# Patient Record
Sex: Female | Born: 1980 | Race: White | Hispanic: Yes | Marital: Married | Smoking: Never smoker
Health system: Southern US, Community
[De-identification: ages and names within clinical notes are randomized; demographics above are authoritative.]

## PROBLEM LIST (undated history)

## (undated) DIAGNOSIS — Z789 Other specified health status: Secondary | ICD-10-CM

## (undated) DIAGNOSIS — B977 Papillomavirus as the cause of diseases classified elsewhere: Secondary | ICD-10-CM

## (undated) HISTORY — PX: NO PAST SURGERIES: SHX2092

## (undated) HISTORY — PX: BREAST BIOPSY: SHX20

---

## 2005-01-07 ENCOUNTER — Ambulatory Visit: Payer: Self-pay | Admitting: Internal Medicine

## 2005-09-15 ENCOUNTER — Ambulatory Visit: Payer: Self-pay | Admitting: Internal Medicine

## 2007-05-07 ENCOUNTER — Encounter: Payer: Self-pay | Admitting: Internal Medicine

## 2008-06-26 ENCOUNTER — Encounter: Payer: Self-pay | Admitting: Family Medicine

## 2008-06-26 ENCOUNTER — Other Ambulatory Visit: Admission: RE | Admit: 2008-06-26 | Discharge: 2008-06-26 | Payer: Self-pay | Admitting: Family Medicine

## 2008-06-26 ENCOUNTER — Ambulatory Visit: Payer: Self-pay | Admitting: Family Medicine

## 2008-06-30 ENCOUNTER — Ambulatory Visit: Payer: Self-pay | Admitting: Family Medicine

## 2008-07-11 LAB — CONVERTED CEMR LAB
AST: 17 units/L (ref 0–37)
Albumin: 4.8 g/dL (ref 3.5–5.2)
Alkaline Phosphatase: 92 units/L (ref 39–117)
BUN: 16 mg/dL (ref 6–23)
Band Neutrophils: 0 % (ref 0–10)
Basophils Relative: 0 % (ref 0–1)
Bilirubin, Direct: 0.1 mg/dL (ref 0.0–0.3)
CO2: 24 meq/L (ref 19–32)
Calcium: 9.5 mg/dL (ref 8.4–10.5)
Creatinine, Ser: 0.68 mg/dL (ref 0.40–1.20)
Eosinophils Absolute: 0.1 10*3/uL (ref 0.0–0.7)
Eosinophils Relative: 1 % (ref 0–5)
HDL: 74 mg/dL (ref >39)
Indirect Bilirubin: 0.4 mg/dL (ref 0.0–0.9)
MCHC: 35.5 g/dL (ref 30.0–36.0)
Monocytes Relative: 7 % (ref 3–12)
Neutrophils Relative %: 60 % (ref 43–77)
RDW: 13.5 % (ref 11.5–15.5)
Total Bilirubin: 0.5 mg/dL (ref 0.3–1.2)

## 2008-08-03 ENCOUNTER — Ambulatory Visit: Payer: Self-pay | Admitting: Family Medicine

## 2008-08-03 DIAGNOSIS — N949 Unspecified condition associated with female genital organs and menstrual cycle: Secondary | ICD-10-CM

## 2009-03-20 ENCOUNTER — Inpatient Hospital Stay (HOSPITAL_COMMUNITY): Admission: AD | Admit: 2009-03-20 | Discharge: 2009-03-22 | Payer: Self-pay | Admitting: Obstetrics and Gynecology

## 2010-07-31 LAB — CBC
HCT: 25.9 % — ABNORMAL LOW (ref 36.0–46.0)
HCT: 36.7 % (ref 36.0–46.0)
Hemoglobin: 12.2 g/dL (ref 12.0–15.0)
Hemoglobin: 8.8 g/dL — ABNORMAL LOW (ref 12.0–15.0)
MCHC: 33.2 g/dL (ref 30.0–36.0)
MCHC: 33.9 g/dL (ref 30.0–36.0)
MCV: 91.3 fL (ref 78.0–100.0)
MCV: 91.5 fL (ref 78.0–100.0)
Platelets: 155 10*3/uL (ref 150–400)
RBC: 4.01 MIL/uL (ref 3.87–5.11)
RDW: 14.4 % (ref 11.5–15.5)
WBC: 8.9 10*3/uL (ref 4.0–10.5)

## 2011-04-29 NOTE — L&D Delivery Note (Signed)
Delivery Note   Onset of labor 2300 SROM - PCN protocol for GBS carrier status Variables in labor - mild with nadir to 90  Analgesia /Anesthesia intrapartum: epidural  Complete dilation at 0221 at +2 Onset of pushing at 0221 FHR second stage mild variables to nadir 90  Delivery of a viable female at 38 by CNM rotation from OT to LOA with crowning position.  Nuchal Cord none - noted false knot 4 cm from fetal insertion site . Cord double clamped after cessation of pulsation, cut by FOB.  Cord blood sample collected.   Placenta delivered shultz intact with 3 VC at 0238 Placenta for disposal. Uterine tone intermittent atony - responded to IV pitocin / uterine massage with expression of clots /  bleeding moderate Urinary straight catheterization by nurse to decompress bladder Superficial  Laceration at introitus identified - single suture 4-0 for hemostasis  Est. Blood Loss (mL):400   Complications: none  Mom to postpartum.  Baby to Mom for bonding / breast-feeding.  Marlinda Mike CNM, MSN 01/17/2012, 2:51 AM

## 2011-06-12 LAB — OB RESULTS CONSOLE RUBELLA ANTIBODY, IGM: Rubella: IMMUNE

## 2011-06-12 LAB — OB RESULTS CONSOLE ABO/RH: RH Type: NEGATIVE

## 2011-06-12 LAB — OB RESULTS CONSOLE ANTIBODY SCREEN: Antibody Screen: NEGATIVE

## 2011-06-23 LAB — OB RESULTS CONSOLE GC/CHLAMYDIA: Gonorrhea: NEGATIVE

## 2012-01-17 ENCOUNTER — Encounter (HOSPITAL_COMMUNITY): Payer: Self-pay | Admitting: *Deleted

## 2012-01-17 ENCOUNTER — Inpatient Hospital Stay (HOSPITAL_COMMUNITY)
Admission: AD | Admit: 2012-01-17 | Discharge: 2012-01-19 | DRG: 372 | Disposition: A | Discharge status: Home / Self Care | Payer: BC Managed Care – PPO | Source: Ambulatory Visit | Attending: Obstetrics | Admitting: Obstetrics

## 2012-01-17 ENCOUNTER — Inpatient Hospital Stay (HOSPITAL_COMMUNITY): Payer: BC Managed Care – PPO | Admitting: Anesthesiology

## 2012-01-17 ENCOUNTER — Encounter (HOSPITAL_COMMUNITY): Payer: Self-pay | Admitting: Anesthesiology

## 2012-01-17 DIAGNOSIS — O9903 Anemia complicating the puerperium: Principal | ICD-10-CM | POA: Diagnosis not present

## 2012-01-17 DIAGNOSIS — O99892 Other specified diseases and conditions complicating childbirth: Secondary | ICD-10-CM | POA: Diagnosis present

## 2012-01-17 DIAGNOSIS — D62 Acute posthemorrhagic anemia: Secondary | ICD-10-CM | POA: Diagnosis not present

## 2012-01-17 DIAGNOSIS — Z22330 Carrier of Group B streptococcus: Secondary | ICD-10-CM

## 2012-01-17 HISTORY — DX: Papillomavirus as the cause of diseases classified elsewhere: B97.7

## 2012-01-17 HISTORY — DX: Other specified health status: Z78.9

## 2012-01-17 LAB — CBC
HCT: 33.5 % — ABNORMAL LOW (ref 36.0–46.0)
Hemoglobin: 11.3 g/dL — ABNORMAL LOW (ref 12.0–15.0)
MCH: 30.2 pg (ref 26.0–34.0)
MCHC: 33.7 g/dL (ref 30.0–36.0)
MCV: 89.6 fL (ref 78.0–100.0)
Platelets: 185 10*3/uL (ref 150–400)
RBC: 3.74 MIL/uL — ABNORMAL LOW (ref 3.87–5.11)
RDW: 13.6 % (ref 11.5–15.5)
WBC: 7 10*3/uL (ref 4.0–10.5)

## 2012-01-17 LAB — TYPE AND SCREEN
ABO/RH(D): O NEG
Antibody Screen: POSITIVE
DAT, IgG: NEGATIVE

## 2012-01-17 LAB — RPR: RPR Ser Ql: NONREACTIVE

## 2012-01-17 MED ORDER — LACTATED RINGERS IV SOLN
INTRAVENOUS | Status: DC
Start: 2012-01-17 — End: 2012-01-17

## 2012-01-17 MED ORDER — METHYLERGONOVINE MALEATE 0.2 MG/ML IJ SOLN: 0.2000 mg | INTRAMUSCULAR | Status: DC | PRN

## 2012-01-17 MED ORDER — IBUPROFEN 600 MG PO TABS
600.0000 mg | ORAL_TABLET | Freq: Four times a day (QID) | ORAL | Status: DC
  Administered 2012-01-17 – 2012-01-19 (×9): 600 mg via ORAL
  Filled 2012-01-17 (×9): qty 1

## 2012-01-17 MED ORDER — LANOLIN HYDROUS EX OINT: TOPICAL_OINTMENT | CUTANEOUS | Status: DC | PRN

## 2012-01-17 MED ORDER — PHENYLEPHRINE 40 MCG/ML (10ML) SYRINGE FOR IV PUSH (FOR BLOOD PRESSURE SUPPORT): 80.0000 ug | PREFILLED_SYRINGE | INTRAVENOUS | Status: DC | PRN

## 2012-01-17 MED ORDER — ONDANSETRON HCL 4 MG PO TABS: 4.0000 mg | ORAL_TABLET | ORAL | Status: DC | PRN

## 2012-01-17 MED ORDER — WITCH HAZEL-GLYCERIN EX PADS: 1.0000 "application " | MEDICATED_PAD | CUTANEOUS | Status: DC | PRN

## 2012-01-17 MED ORDER — IBUPROFEN 600 MG PO TABS: 600.0000 mg | ORAL_TABLET | Freq: Four times a day (QID) | ORAL | Status: DC | PRN

## 2012-01-17 MED ORDER — FENTANYL 2.5 MCG/ML BUPIVACAINE 1/10 % EPIDURAL INFUSION (WH - ANES)
14.0000 mL/h | INTRAMUSCULAR | Status: DC
  Administered 2012-01-17: 14 mL/h via EPIDURAL
  Filled 2012-01-17: qty 60

## 2012-01-17 MED ORDER — LACTATED RINGERS IV SOLN: 500.0000 mL | INTRAVENOUS | Status: DC | PRN

## 2012-01-17 MED ORDER — ONDANSETRON HCL 4 MG/2ML IJ SOLN: 4.0000 mg | Freq: Four times a day (QID) | INTRAMUSCULAR | Status: DC | PRN

## 2012-01-17 MED ORDER — METHYLERGONOVINE MALEATE 0.2 MG PO TABS: 0.2000 mg | ORAL_TABLET | ORAL | Status: DC | PRN

## 2012-01-17 MED ORDER — OXYCODONE-ACETAMINOPHEN 5-325 MG PO TABS: 1.0000 | ORAL_TABLET | ORAL | Status: DC | PRN

## 2012-01-17 MED ORDER — PENICILLIN G POTASSIUM 5000000 UNITS IJ SOLR: 5.0000 10*6.[IU] | Freq: Once | INTRAVENOUS | Status: DC

## 2012-01-17 MED ORDER — OXYCODONE-ACETAMINOPHEN 5-325 MG PO TABS
1.0000 | ORAL_TABLET | ORAL | Status: DC | PRN
  Administered 2012-01-18: 2 via ORAL
  Filled 2012-01-17: qty 2

## 2012-01-17 MED ORDER — PENICILLIN G POTASSIUM 5000000 UNITS IJ SOLR: 2.5000 10*6.[IU] | INTRAVENOUS | Status: DC

## 2012-01-17 MED ORDER — DIPHENHYDRAMINE HCL 50 MG/ML IJ SOLN: 12.5000 mg | INTRAMUSCULAR | Status: DC | PRN

## 2012-01-17 MED ORDER — LIDOCAINE HCL (PF) 1 % IJ SOLN
INTRAMUSCULAR | Status: DC | PRN
  Administered 2012-01-17 (×2): 5 mL

## 2012-01-17 MED ORDER — PENICILLIN G POTASSIUM 5000000 UNITS IJ SOLR
5.0000 10*6.[IU] | Freq: Once | INTRAVENOUS | Status: AC
  Administered 2012-01-17: 5 10*6.[IU] via INTRAVENOUS
  Filled 2012-01-17: qty 5

## 2012-01-17 MED ORDER — SIMETHICONE 80 MG PO CHEW: 80.0000 mg | CHEWABLE_TABLET | ORAL | Status: DC | PRN

## 2012-01-17 MED ORDER — OXYTOCIN BOLUS FROM INFUSION
500.0000 mL | Freq: Once | INTRAVENOUS | Status: DC
  Filled 2012-01-17: qty 500

## 2012-01-17 MED ORDER — OXYTOCIN 40 UNITS IN LACTATED RINGERS INFUSION - SIMPLE MED
62.5000 mL/h | Freq: Once | INTRAVENOUS | Status: AC
  Administered 2012-01-17: 62.5 mL/h via INTRAVENOUS
  Filled 2012-01-17: qty 1000

## 2012-01-17 MED ORDER — BENZOCAINE-MENTHOL 20-0.5 % EX AERO: 1.0000 "application " | INHALATION_SPRAY | CUTANEOUS | Status: DC | PRN

## 2012-01-17 MED ORDER — LACTATED RINGERS IV SOLN: 500.0000 mL | Freq: Once | INTRAVENOUS | Status: DC

## 2012-01-17 MED ORDER — ONDANSETRON HCL 4 MG/2ML IJ SOLN: 4.0000 mg | INTRAMUSCULAR | Status: DC | PRN

## 2012-01-17 MED ORDER — CITRIC ACID-SODIUM CITRATE 334-500 MG/5ML PO SOLN: 30.0000 mL | ORAL | Status: DC | PRN

## 2012-01-17 MED ORDER — PHENYLEPHRINE 40 MCG/ML (10ML) SYRINGE FOR IV PUSH (FOR BLOOD PRESSURE SUPPORT)
80.0000 ug | PREFILLED_SYRINGE | INTRAVENOUS | Status: DC | PRN
  Filled 2012-01-17: qty 5

## 2012-01-17 MED ORDER — EPHEDRINE 5 MG/ML INJ: 10.0000 mg | INTRAVENOUS | Status: DC | PRN

## 2012-01-17 MED ORDER — DIBUCAINE 1 % RE OINT: 1.0000 "application " | TOPICAL_OINTMENT | RECTAL | Status: DC | PRN

## 2012-01-17 MED ORDER — LIDOCAINE HCL (PF) 1 % IJ SOLN
30.0000 mL | INTRAMUSCULAR | Status: DC | PRN
  Filled 2012-01-17: qty 30

## 2012-01-17 MED ORDER — EPHEDRINE 5 MG/ML INJ
10.0000 mg | INTRAVENOUS | Status: DC | PRN
  Filled 2012-01-17: qty 4

## 2012-01-17 MED ORDER — ACETAMINOPHEN 325 MG PO TABS: 650.0000 mg | ORAL_TABLET | ORAL | Status: DC | PRN

## 2012-01-17 MED ORDER — SENNOSIDES-DOCUSATE SODIUM 8.6-50 MG PO TABS: 2.0000 | ORAL_TABLET | Freq: Every day | ORAL | Status: DC

## 2012-01-17 MED ORDER — PRENATAL 27-0.8 MG PO TABS: 1.0000 | ORAL_TABLET | Freq: Every day | ORAL | Status: DC

## 2012-01-17 MED ORDER — PENICILLIN G POTASSIUM 5000000 UNITS IJ SOLR
2.5000 10*6.[IU] | INTRAVENOUS | Status: DC
  Filled 2012-01-17 (×4): qty 2.5

## 2012-01-17 MED ORDER — TETANUS-DIPHTH-ACELL PERTUSSIS 5-2.5-18.5 LF-MCG/0.5 IM SUSP: 0.5000 mL | Freq: Once | INTRAMUSCULAR | Status: DC

## 2012-01-17 MED ORDER — DIPHENHYDRAMINE HCL 25 MG PO CAPS: 25.0000 mg | ORAL_CAPSULE | Freq: Four times a day (QID) | ORAL | Status: DC | PRN

## 2012-01-17 NOTE — Anesthesia Procedure Notes (Signed)
Epidural Patient location during procedure: OB Start time: 01/17/2012 1:52 AM  Staffing Anesthesiologist: Brayton Caves R Performed by: anesthesiologist   Preanesthetic Checklist Completed: patient identified, site marked, surgical consent, pre-op evaluation, timeout performed, IV checked, risks and benefits discussed and monitors and equipment checked  Epidural Patient position: sitting Prep: site prepped and draped and DuraPrep Patient monitoring: continuous pulse ox and blood pressure Approach: midline Injection technique: LOR air and LOR saline  Needle:  Needle type: Tuohy  Needle gauge: 17 G Needle length: 9 cm and 9 Needle insertion depth: 5 cm cm Catheter type: closed end flexible Catheter size: 19 Gauge Catheter at skin depth: 10 cm Test dose: negative  Assessment Events: blood not aspirated, injection not painful, no injection resistance, negative IV test and no paresthesia  Additional Notes Patient identified.  Risk benefits discussed including failed block, incomplete pain control, headache, nerve damage, paralysis, blood pressure changes, nausea, vomiting, reactions to medication both toxic or allergic, and postpartum back pain.  Patient expressed understanding and wished to proceed.  All questions were answered.  Sterile technique used throughout procedure and epidural site dressed with sterile barrier dressing. No paresthesia or other complications noted.The patient did not experience any signs of intravascular injection such as tinnitus or metallic taste in mouth nor signs of intrathecal spread such as rapid motor block. Please see nursing notes for vital signs.

## 2012-01-17 NOTE — Progress Notes (Signed)
S: Feeling ok - epidural just placed / having pressure with ctx     Tolerating contractions well - breathing with ctx  O:  VS: Blood pressure 101/60, pulse 76, temperature 98.1 F (36.7 C), temperature source Oral, resp. rate 18, height 5\' 4"  (1.626 m), weight 71.215 kg (157 lb).        FHR : baseline 135 / variability moderate / accels + / decels mild variable        Toco: contractions every 2-4 minutes / moderate to strong         Cervix : 8/90/vtx/+1        Membranes: clear fluid leakage  A: active labor     FHR category 1  P: anticipate rapid progression to SVB  Marlinda Mike CNM, MSN 01/17/2012, 2:02 AM

## 2012-01-17 NOTE — MAU Note (Signed)
Pt reports uc's since 2300 and LOF on her way to hospital

## 2012-01-17 NOTE — Anesthesia Postprocedure Evaluation (Signed)
  Anesthesia Post-op Note  Patient: Sheila Kennedy  Procedure(s) Performed: * No procedures listed *  Patient Location: PACU and Mother/Baby  Anesthesia Type: Epidural  Level of Consciousness: awake, alert , oriented and patient cooperative  Airway and Oxygen Therapy: Patient Spontanous Breathing  Post-op Pain: none  Post-op Assessment: Post-op Vital signs reviewed and Patient's Cardiovascular Status Stable  Post-op Vital Signs: Reviewed and stable  Complications: No apparent anesthesia complications

## 2012-01-17 NOTE — Progress Notes (Signed)
Marlinda Mike cnm notified of patient, tracing, ctx pattern, sve result. Order received to admit and start the penicillin protocol.

## 2012-01-17 NOTE — Anesthesia Preprocedure Evaluation (Signed)

## 2012-01-17 NOTE — H&P (Signed)
  OB ADMISSION/ HISTORY & PHYSICAL:  Admission Date: 01/17/2012 12:21 AM  Admit Diagnosis: normal labor / SROM   Sheila Kennedy is a 31 y.o. female presenting for onset of labor / SROM upon arrival to hospital.  Prenatal History: G2P1001   EDC : 01/30/2012, Alternate EDD Entry  Prenatal care at [facility] since [redacted] weeks gestation  Prenatal course complicated by GBS bacturia  Prenatal Labs: ABO, Rh: O (02/14 0000) negative Antibody: Negative (02/14 0000) Rubella: Immune (02/14 0000)  RPR: Nonreactive (02/14 0000)  HBsAg: Negative (02/14 0000)  HIV: Non-reactive (02/14 0000)  GBS: positive bacturia 1 hr Glucola : normal   Medical / Surgical History :  Past medical history:  Past Medical History  Diagnosis Date  . HPV in female   . No pertinent past medical history      Past surgical history:  Past Surgical History  Procedure Date  . No past surgeries      Family History:  Family History  Problem Relation Age of Onset  . Other Neg Hx      Social History:  reports that she has never smoked. She has never used smokeless tobacco. She reports that she does not drink alcohol or use illicit drugs.   Allergies: Review of patient's allergies indicates no known allergies.  NKDA  Current Medications at time of admission:  Prenatal vitamin  Review of Systems: Ctx starting at 11pm SROM 0100  Physical Exam:  Dilation: 5 Effacement (%): 80 Station: -2 Exam by:: B Mosca  General: Uncomfortable with ctx Heart:RRR Lungs: clear Abdomen:gravid and non-tender Extremities: trace edema  FHR: 135 moderate variability / moderate variability / + accels / mild variables nadir 120 x 10 sec TOCO: every 5 minutes   Assessment:  38 weeks latent labor with SROM (+) GBS bacturia Category 1 EFM tracing  Plan:  Admit PCN protocol Epidural for pain management Anticipate SVB  Marlinda Mike CNM, MSN 01/17/2012, 1:39 AM

## 2012-01-18 ENCOUNTER — Encounter (HOSPITAL_COMMUNITY): Payer: Self-pay | Admitting: *Deleted

## 2012-01-18 LAB — CBC
HCT: 29.6 % — ABNORMAL LOW (ref 36.0–46.0)
Hemoglobin: 9.9 g/dL — ABNORMAL LOW (ref 12.0–15.0)
MCH: 30.4 pg (ref 26.0–34.0)
MCHC: 33.4 g/dL (ref 30.0–36.0)
MCV: 90.8 fL (ref 78.0–100.0)
Platelets: 152 10*3/uL (ref 150–400)
RBC: 3.26 MIL/uL — ABNORMAL LOW (ref 3.87–5.11)
RDW: 13.7 % (ref 11.5–15.5)
WBC: 8.7 10*3/uL (ref 4.0–10.5)

## 2012-01-18 MED ORDER — DOCUSATE SODIUM 100 MG PO CAPS
100.0000 mg | ORAL_CAPSULE | Freq: Every day | ORAL | Status: DC
  Administered 2012-01-18 – 2012-01-19 (×2): 100 mg via ORAL
  Filled 2012-01-18 (×3): qty 1

## 2012-01-18 MED ORDER — POLYSACCHARIDE IRON COMPLEX 150 MG PO CAPS
150.0000 mg | ORAL_CAPSULE | Freq: Every day | ORAL | Status: DC
  Administered 2012-01-18 – 2012-01-19 (×2): 150 mg via ORAL
  Filled 2012-01-18 (×3): qty 1

## 2012-01-18 NOTE — Progress Notes (Signed)
PPD 1 SVD  S:  Reports feeling well - no discharge today - newborn to stay full 48 hours (GBS hx)             Tolerating po/ No nausea or vomiting             Bleeding is light             Pain controlled with motrin             Up ad lib / ambulatory  Newborn breast feeding     O:  A & O x 3              VS: Blood pressure 105/66, pulse 81, temperature 97.5 F (36.4 C), temperature source Oral, resp. rate 18, height 5\' 4"  (1.626 m), weight 71.215 kg (157 lb), SpO2 96.00%, unknown if currently breastfeeding.  LABS: WBC/Hgb/Hct/Plts:  8.7/9.9/29.6/152 (09/22 0548)   Lungs: Clear and unlabored  Heart: regular rate and rhythm / no mumurs  Abdomen: soft, non-tender, non-distended              Fundus: firm, non-tender, U-2  Perineum: no edema  Lochia: light  Extremities: no edema, no calf pain or tenderness    A: PPD # 1              Mild ABL anemia - start iron & colace  Doing well - stable status  P:  Routine post partum orders  Discharge tomorrow  Marlinda Mike CNM, MSN 01/18/2012, 10:18 AM

## 2012-01-19 MED ORDER — DSS 100 MG PO CAPS: 100.0000 mg | ORAL_CAPSULE | Freq: Every day | ORAL | Status: DC

## 2012-01-19 MED ORDER — POLYSACCHARIDE IRON COMPLEX 150 MG PO CAPS: 150.0000 mg | ORAL_CAPSULE | Freq: Every day | ORAL | Status: DC

## 2012-01-19 MED ORDER — IBUPROFEN 600 MG PO TABS: 600.0000 mg | ORAL_TABLET | Freq: Four times a day (QID) | ORAL | Status: DC

## 2012-01-19 NOTE — Discharge Summary (Signed)
Obstetric Discharge Summary  Reason for Admission: onset of labor Prenatal Procedures: none Intrapartum Procedures: spontaneous vaginal delivery                                             GBS prophylaxis - single dose in at delivery Postpartum Procedures: none Complications-Operative and Postpartum: none Hemoglobin  Date Value Range Status  01/18/2012 9.9* 12.0 - 15.0 g/dL Final     HCT  Date Value Range Status  01/18/2012 29.6* 36.0 - 46.0 % Final    Physical Exam:  General: alert, cooperative and no distress Lochia: appropriate Uterine Fundus: firm Incision: healing well DVT Evaluation: No evidence of DVT seen on physical exam.  Discharge Diagnoses: Term Pregnancy-delivered and mild ABL anemia  Discharge Information: Date: 01/19/2012 Activity: pelvic rest Diet: routine Medications: PNV and Ibuprofen Condition: stable Instructions: refer to practice specific booklet Discharge to: home   Newborn Data: Live born female  Birth Weight: 6 lb 6.3 oz (2900 g) APGAR: 8, 9  Home with mother.  Sheila Kennedy 01/19/2012, 9:48 AM

## 2012-01-19 NOTE — Progress Notes (Signed)
PPD 2 SVD  S:  Reports feeling well- ready to go home             Tolerating po/ No nausea or vomiting             Bleeding is light             Pain controlled with mortrin only             Up ad lib / ambulatory  Newborn breast feeding   O:  A & O x 3              VS: Blood pressure 102/65, pulse 74, temperature 98.3 F (36.8 C), temperature source Oral, resp. rate 18, height 5\' 4"  (1.626 m), weight 71.215 kg (157 lb), SpO2 96.00%, unknown if currently breastfeeding.    Abdomen: soft, non-tender, non-distended              Fundus: firm, non-tender, U-1  Perineum: no edema   Lochia: light  Extremities: no edema, no calf pain or tenderness    A: PPD # 2   Doing well - stable status  P:  Routine post partum orders  discharge home             WOB booklet  Marlinda Mike CNM, MSN 01/19/2012, 9:47 AM

## 2014-02-03 ENCOUNTER — Other Ambulatory Visit: Payer: Self-pay | Admitting: Certified Nurse Midwife

## 2014-02-03 DIAGNOSIS — E041 Nontoxic single thyroid nodule: Secondary | ICD-10-CM

## 2014-02-07 ENCOUNTER — Ambulatory Visit
Admission: RE | Admit: 2014-02-07 | Discharge: 2014-02-07 | Disposition: A | Discharge status: Home / Self Care | Payer: BC Managed Care – PPO | Source: Ambulatory Visit | Attending: Certified Nurse Midwife | Admitting: Certified Nurse Midwife

## 2014-02-07 DIAGNOSIS — E041 Nontoxic single thyroid nodule: Secondary | ICD-10-CM

## 2014-02-27 ENCOUNTER — Encounter (HOSPITAL_COMMUNITY): Payer: Self-pay | Admitting: *Deleted

## 2016-11-11 ENCOUNTER — Ambulatory Visit (INDEPENDENT_AMBULATORY_CARE_PROVIDER_SITE_OTHER): Payer: BC Managed Care – PPO | Admitting: Family Medicine

## 2016-11-11 ENCOUNTER — Encounter: Payer: Self-pay | Admitting: Family Medicine

## 2016-11-11 VITALS — BP 116/70 | HR 68 | Resp 12 | Ht 64.0 in | Wt 142.0 lb

## 2016-11-11 DIAGNOSIS — L739 Follicular disorder, unspecified: Secondary | ICD-10-CM | POA: Diagnosis not present

## 2016-11-11 DIAGNOSIS — L02419 Cutaneous abscess of limb, unspecified: Secondary | ICD-10-CM | POA: Diagnosis not present

## 2016-11-11 MED ORDER — DOXYCYCLINE HYCLATE 100 MG PO TABS: 100.0000 mg | ORAL_TABLET | Freq: Two times a day (BID) | ORAL | 0 refills | Status: AC

## 2016-11-11 NOTE — Progress Notes (Signed)
ACUTE VISIT   HPI:  Chief Complaint  Patient presents with  . Mass    axillar    [name] is a 36 y.o. female, who is here today with her friend complaining of  "lumps" on axillas for about a week.  She had similar lesions about a month ago, treated by her gyn with oral abx and resolved. Lesions started last week while she was on vacation, in the beach. Lesions are tender, erythematous, no active drainage.  She has not identified exacerbating factors. No new medication,deodorant,of shaving habits.   Rash  This is a recurrent problem. The current episode started in the past 7 days. The problem has been gradually improving since onset. The affected locations include the left axilla and right axilla. The rash is characterized by redness, swelling and pain. She was exposed to nothing. Pertinent negatives include no anorexia, congestion, cough, diarrhea, facial edema, fatigue, fever, joint pain, nail changes, shortness of breath, sore throat or vomiting. Treatments tried: warm compresses. The treatment provided mild relief. There is no history of allergies or eczema.   She is concerned about having a "blood infection" that may be causing problem. She denies any other health problems.  Mammogram 12/2015 and according to pt, she had a right breast "cyst", deemed to be benign. Next appt with her gyn in 12/2016.  Last HIV test last year, denies risk factors.   Review of Systems  Constitutional: Negative for activity change, appetite change, chills, fatigue and fever.  HENT: Negative for congestion, mouth sores, sore throat, trouble swallowing and voice change.   Eyes: Negative for discharge and redness.  Respiratory: Negative for cough, shortness of breath and wheezing.   Cardiovascular: Negative for leg swelling.  Gastrointestinal: Negative for abdominal pain, anorexia, diarrhea, nausea and vomiting.  Endocrine: Negative for polydipsia, polyphagia and polyuria.    Musculoskeletal: Negative for arthralgias, gait problem, joint pain and myalgias.  Skin: Positive for rash. Negative for nail changes and wound.  Allergic/Immunologic: Negative for environmental allergies.  Neurological: Negative for weakness, numbness and headaches.  Psychiatric/Behavioral: Negative for confusion. The patient is nervous/anxious.     No current outpatient prescriptions on file prior to visit.   No current facility-administered medications on file prior to visit.     Past Medical History:  Diagnosis Date  . HPV in female   . No pertinent past medical history    No Known Allergies  Social History   Social History  . Marital status: Married    Spouse name: N/A  . Number of children: N/A  . Years of education: N/A   Social History Main Topics  . Smoking status: Never Smoker  . Smokeless tobacco: Never Used  . Alcohol use No  . Drug use: No  . Sexual activity: Yes   Other Topics Concern  . None   Social History Narrative  . None    Vitals:   11/11/16 0752  BP: 116/70  Pulse: 68  Resp: 12  O2 sat at RA 98% Body mass index is 24.37 kg/m.   Physical Exam  Nursing note and vitals reviewed. Constitutional: She is oriented to person, place, and time. She appears well-developed and well-nourished. No distress.  HENT:  Head: Atraumatic.  Mouth/Throat: Oropharynx is clear and moist and mucous membranes are normal.  Eyes: Conjunctivae are normal.  Cardiovascular: Normal rate and regular rhythm.   No murmur heard. Respiratory: Effort normal and breath sounds normal. No respiratory distress.  Genitourinary: No  breast swelling, tenderness or discharge.  Genitourinary Comments: Fibrocystic changes bilateral on outer and upper quadrants.  Musculoskeletal: She exhibits no edema or tenderness.  Lymphadenopathy:    She has no cervical adenopathy.    She has no axillary adenopathy.       Right: No supraclavicular adenopathy present.       Left: No  supraclavicular adenopathy present.  Neurological: She is alert and oriented to person, place, and time. She has normal strength. Gait normal.  Skin: Skin is warm. Rash noted. Rash is nodular and pustular.  Left axilla 3 cm nodular lesion palpated, + mild erythema, and small/superficial fluctuant area. Right axilla with 1.5 with nodule with 3 mm pustula in the middle, not tender.  Psychiatric: Her speech is normal. Her mood appears anxious.  Well groomed, good eye contact.    ASSESSMENT AND PLAN:  [name] was seen today for mass in axillas.  Diagnoses and all orders for this visit:  Abscess of axilla  She refused I&D. Because second episode I recommend long course of abx treatment. Some side effects of abx discussed,including photosensitivity. Instructed about warning signs. If not greatly better in 48-72 hours she needs to follow with PCP.  -     doxycycline (VIBRA-TABS) 100 MG tablet; Take 1 tablet (100 mg total) by mouth 2 (two) times daily.  Folliculitis of both axillae  Vs Hydradenitis. A few possible trigger factors discussed. If recurrent she might want to consider another hair removal technique or deodorant discontinuation. Reassured about the possibility of systemic infectious disease as etiologic factor. Follow-up as needed.    Return in about 4 weeks (around 12/09/2016), or if symptoms worsen or fail to improve.     -[name] was advised to seek immediate medical attention if sudden worsening symptoms or to follow if needed in 3-4 weeks.      Sheila Kennedy G. [place], MD  [facility]. [place] office.

## 2016-11-11 NOTE — Patient Instructions (Signed)
Ms.Elfreda Regino Bellow I have seen you today for an acute visit.  A few things to remember from today's visit:   Abscess of axilla - Plan: doxycycline (VIBRA-TABS) 884 MG tablet  Folliculitis of both axillae  Vs Hydradenitis.  Evite sol directamente durante eltiempo que este tomando antibiotico.  Foliculitis (Folliculitis) La foliculitis es una inflamacin de los folculos capilares. La foliculitis ocurre con mayor frecuencia en el cuero cabelludo, los muslos, las piernas, la espalda y las nalgas. Sin embargo, Financial controller parte del cuerpo. CAUSAS Esta afeccin puede ser causada por lo siguiente:  Una infeccin bacteriana (frecuente).  Infecciones por hongos.  Infecciones virales.  Contacto con ciertas sustancias qumicas, especialmente aceites y alquitrn.  Rasurarse o depilarse.  Aplicacin frecuente de cremas o ungentos grasosos en la piel. La foliculitis de duracin prolongada y la foliculitis recurrente pueden ser causadas por bacterias que viven en las narinas. FACTORES DE RIESGO Es ms probable que esta afeccin se desarrolle en las personas que tengan lo siguiente:  Un sistema inmunitario debilitado.  Diabetes.  Obesidad. SNTOMAS Los sntomas de esta afeccin incluyen lo siguiente:  Enrojecimiento.  Inflamacin.  Hinchazn.  Picazn.  Pequeos puntos blancos o amarillos llenos de pus (pstulas) que pican y aparecen sobre una zona enrojecida. Si hay una infeccin que se adentra en el folculo, puede formarse un fornculo.  Un conjunto de fornculos agrupados estrechamente (carbunclo). Estos fornculos tienden a formarse en zonas del cuerpo con mucho pelo y sudorosas. DIAGNSTICO Esta afeccin se diagnostica con un examen de la piel. Para hallar la causa de la afeccin, el mdico puede tomar una [place] de uno de los fornculos o pstulas y Personnel officer. TRATAMIENTO El tratamiento de este trastorno puede incluir lo siguiente:  La  aplicacin de compresas calientes en la zona afectada.  La administracin de antibiticos o la aplicacin de un antibitico a la piel.  La aplicacin de una solucin antisptica o darse un bao con esta solucin.  La administracin de un medicamento de venta libre para Astronomer.  La realizacin de un procedimiento para drenar las pstulas o los fornculos. Se puede realizar si una pstula o un fornculo contienen mucho pus o lquido.  La extraccin del pelo con lser. Se puede realizar para tratar una foliculitis de duracin prolongada. INSTRUCCIONES PARA EL CUIDADO EN EL HOGAR  Si se lo indican, aplique calor en la zona afectada tan frecuentemente como se lo haya indicado el mdico. Use la fuente de calor que el mdico le recomiende, como una compresa de calor hmedo o una almohadilla trmica. ? Coloque una [employer] piel y la fuente de Freight forwarder. ? Aplique el calor durante 20 a 34minutos. ? Retire la fuente de calor si la piel se le pone de color rojo brillante. Esto es muy importante si no puede sentir el dolor, el calor o el fro. Puede correr un riesgo mayor de sufrir quemaduras.  Si le recetaron un antibitico, tmelo como se lo haya indicado el mdico. No deje de usar el antibitico aunque comience a Sports administrator.  Tome los medicamentos de venta libre y los recetados solamente como se lo haya indicado el mdico.  No rasure la piel irritada.  Concurra a todas las visitas de control como se lo haya indicado el mdico. Esto es importante. SOLICITE ATENCIN MDICA DE INMEDIATO SI:  Tiene ms enrojecimiento, hinchazn o dolor en la zona afectada.  Hay lneas rojas que se extienden desde la zona afectada.  Tiene fiebre. Esta informacin no  tiene Marine scientist el consejo del mdico. Asegrese de hacerle al mdico cualquier pregunta que tenga. Document Released: 04/14/2005 Document Revised: 10/14/2011 Document Reviewed: 02/02/2015 Elsevier Interactive Patient  Education  2018 [employer].        Medications prescribed today are intended for short period of time and will not be refill upon request, a follow up appointment might be necessary to discuss continuation of of treatment if appropriate.     In general please monitor for signs of worsening symptoms and seek immediate medical attention if any concerning.  If symptoms are not resolved in a few days/weeks you should schedule a follow up appointment with your doctor, before if needed.  Please be sure you have an appointment already scheduled with your PCP before you leave today.

## 2017-01-16 ENCOUNTER — Encounter: Payer: Self-pay | Admitting: Family Medicine

## 2017-04-28 HISTORY — PX: BREAST BIOPSY: SHX20

## 2017-07-22 ENCOUNTER — Other Ambulatory Visit: Payer: Self-pay | Admitting: Radiology

## 2017-10-19 ENCOUNTER — Encounter: Payer: Self-pay | Admitting: Family Medicine

## 2017-11-02 ENCOUNTER — Encounter: Payer: Self-pay | Admitting: Family Medicine

## 2017-11-02 ENCOUNTER — Ambulatory Visit: Payer: BC Managed Care – PPO | Admitting: Family Medicine

## 2017-11-02 VITALS — BP 120/76 | HR 72 | Temp 99.0°F | Resp 12 | Ht 64.0 in | Wt 140.2 lb

## 2017-11-02 DIAGNOSIS — Z131 Encounter for screening for diabetes mellitus: Secondary | ICD-10-CM

## 2017-11-02 DIAGNOSIS — Z13220 Encounter for screening for lipoid disorders: Secondary | ICD-10-CM | POA: Diagnosis not present

## 2017-11-02 DIAGNOSIS — Z0000 Encounter for general adult medical examination without abnormal findings: Secondary | ICD-10-CM

## 2017-11-02 LAB — LIPID PANEL
CHOLESTEROL: 159 mg/dL (ref 0–200)
HDL: 66.8 mg/dL (ref >39.00)
LDL CALC: 83 mg/dL (ref 0–99)
NonHDL: 92.25
TRIGLYCERIDES: 47 mg/dL (ref 0.0–149.0)
Total CHOL/HDL Ratio: 2
VLDL: 9.4 mg/dL (ref 0.0–40.0)

## 2017-11-02 LAB — BASIC METABOLIC PANEL
BUN: 10 mg/dL (ref 6–23)
CHLORIDE: 103 meq/L (ref 96–112)
CO2: 27 mEq/L (ref 19–32)
CREATININE: 0.72 mg/dL (ref 0.40–1.20)
Calcium: 9.1 mg/dL (ref 8.4–10.5)
GFR: 96.43 mL/min (ref >60.00)
Glucose, Bld: 85 mg/dL (ref 70–99)
Potassium: 3.8 mEq/L (ref 3.5–5.1)
Sodium: 137 mEq/L (ref 135–145)

## 2017-11-02 LAB — HEMOGLOBIN A1C: HEMOGLOBIN A1C: 5.5 % (ref 4.6–6.5)

## 2017-11-02 NOTE — Patient Instructions (Addendum)
A few things to remember from today's visit:   Routine general medical examination at a health care facility  Diabetes mellitus screening - Plan: Basic metabolic panel, Hemoglobin A1c  Screening for lipid disorders - Plan: Lipid panel   Mantenimiento de la salud - Mujeres (Health Maintenance, Female) Un estilo de vida saludable y los cuidados preventivos pueden favorecer considerablemente a la salud y Musician. Pregunte a su mdico cul es el cronograma de exmenes peridicos apropiado para usted. Esta es una buena oportunidad para consultarlo sobre cmo prevenir enfermedades y Chancellor sano. Adems de los controles, hay muchas otras cosas que puede hacer usted mismo. Los expertos han realizado numerosas investigaciones [employer] cambios en el estilo de vida y las medidas de prevencin que, [place], lo ayudarn a mantenerse sano. Solicite a su mdico ms informacin. EL PESO Y LA DIETA Consuma una dieta saludable.  Asegrese de [employer] verduras, frutas, productos lcteos de bajo contenido de [place] y Advertising account planner.  No consuma muchos alimentos de alto contenido de grasas slidas, azcares agregados o sal.  Realice actividad fsica con regularidad. Esta es una de las prcticas ms importantes que puede hacer por su salud. ? La Delorise Shiner de los adultos deben hacer ejercicio durante al menos 156mnutos por semana. El ejercicio debe aumentar la frecuencia cardaca y pActorla transpiracin (ejercicio de i[place]. ? La mayora de los adultos tambin deben hacer ejercicios de elongacin al mToysRusveces a la semana. Agregue esto al su plan de ejercicio de intensidad moderada. Mantenga un peso saludable.  El ndice de masa corporal ([facility] es una medida que puede utilizarse para identificar posibles problemas de pWolcottville Proporciona una estimacin de la grasa corporal basndose en el peso y la altura. Su mdico puede ayudarle a dRadiation protection practitionerIAndersony a lScientist, forensico  mTheatre managerun peso saludable.  Para las mujeres de 20aos o ms: ? Un IFulton Medical Centermenor de 18,5 se considera bajo peso. ? Un ILee And Bae Gi Medical Corporationentre 18,5 y 24,9 es normal. ? Un INew York Community Hospitalentre 25 y 29,9 se considera sobrepeso. ? Un IMC de 30 o ms se considera obesidad. Observe los niveles de colesterol y lpidos en la sangre.  Debe comenzar a rEnglish as a second language teacherde lpidos y cResearch officer, trade unionen la sangre a los 20aos y luego repetirlos cada 570aos  Es posible que nAutomotive engineerlos niveles de colesterol con mayor frecuencia si: ? Sus niveles de lpidos y colesterol son altos. ? Es mayor de [id number] ? Presenta un alto riesgo de padecer enfermedades cardacas. DETECCIN DE CNCER Cncer de pulmn  Se recomienda realizar exmenes de deteccin de cncer de pulmn a personas adultas entre 551y 842aos que estn en riesgo de dHorticulturist, commercialde pulmn por sus antecedentes de consumo de tabaco.  Se recomienda una tomografa computarizada de baja dosis de los pulmones todos los aos a las personas que: ? Fuman actualmente. ? Hayan dejado el hbito en algn momento en los ltimos 15aos. ? Hayan fumado durante 30aos un paquete diario. Un paquete-ao equivale a fumar un promedio de un paquete de cigarrillos diario durante un ao.  Los exmenes de deteccin anuales deben continuar hasta que hayan pasado 15aos desde que dej de fumar.  Ya no debern realizarse si tiene un problema de salud que le impida recibir tratamiento para eScience writerde pulmn. Cncer de mama  Practique la autoconciencia de la mama. Esto significa reconocer la apariencia normal de sus mamas y cmo las siente.  Tambin significa realizar autoexmenes regulares de las  mamas. Informe a su mdico sobre cualquier cambio, sin importar cun pequeo sea.  Si tiene entre 20 y 108 aos, un mdico debe realizarle un examen clnico de las mamas como parte del examen regular de [place], cada 1 a 3aos.  Si tiene 40aos o ms, debe Information systems manager  clnico de las [employer]. Tambin considere realizarse una [place]) todos los [place].  Si tiene antecedentes familiares de cncer de mama, hable con su mdico para someterse a un estudio gentico.  Si tiene alto riesgo de Chief Financial Officer de mama, hable con su mdico para someterse a Public house manager y [employer].  La evaluacin del gen del cncer de mama (BRCA) se recomienda a mujeres que tengan familiares con cnceres relacionados con el BRCA. Los cnceres relacionados con el BRCA incluyen los siguientes: ? Frio. ? Ovario. ? Trompas. ? Cnceres de peritoneo.  Los resultados de la evaluacin determinarn la necesidad de asesoramiento gentico y de [place] de BRCA1 y BRCA2. Cncer de cuello del tero El mdico puede recomendarle que se haga pruebas peridicas de deteccin de cncer de los rganos de la pelvis (ovarios, tero y vagina). Estas pruebas incluyen un examen plvico, que abarca controlar si se produjeron cambios microscpicos en la superficie del cuello del tero (prueba de Papanicolaou). Pueden recomendarle que se haga estas pruebas cada 3aos, a partir de los 21aos.  A las mujeres que tienen entre 30 y 26aos, los mdicos pueden recomendarles que se sometan a exmenes plvicos y pruebas de Papanicolaou cada 63aos, o a la prueba de Papanicolaou y el examen plvico en combinacin con estudios de deteccin del virus del papiloma humano (VPH) cada 5aos. Algunos tipos de VPH aumentan el riesgo de Chief Financial Officer de cuello del tero. La prueba para la deteccin del VPH tambin puede realizarse a mujeres de cualquier edad cuyos resultados de la prueba de Papanicolaou no sean claros.  Es posible que otros mdicos no recomienden exmenes de deteccin a mujeres no embarazadas que se consideran sujetos de bajo riesgo de Chief Financial Officer de pelvis y que no tienen sntomas. Pregntele al mdico si un examen plvico de deteccin es  adecuado para usted.  Si ha recibido un tratamiento para Science writer cervical o una enfermedad que podra causar cncer, necesitar realizarse una prueba de Papanicolaou y controles durante al menos 61 aos de concluido el [place]. Si no se ha hecho el Papanicolaou con regularidad, debern volver a evaluarse los factores de riesgo (como tener un nuevo compaero sexual), para Teacher, adult education si debe realizarse los estudios nuevamente. Algunas mujeres sufren problemas mdicos que aumentan la probabilidad de Museum/gallery curator cncer de cuello del tero. En estos casos, el mdico podr [employer] se realicen controles y pruebas de Papanicolaou con ms frecuencia. Cncer colorrectal  Este tipo de cncer puede detectarse y a menudo prevenirse.  Por lo general, los estudios de rutina se deben Medical laboratory scientific officer a Field seismologist a Proofreader de los 76 aos y [place] 35 aos.  Sin embargo, el mdico podr aconsejarle que lo haga antes, si tiene factores de riesgo para el cncer de colon.  Tambin puede recomendarle que use un kit de prueba para Hydrologist en la materia fecal.  Es posible que se use una pequea cmara en el extremo de un tubo para examinar directamente el colon (sigmoidoscopia o colonoscopia) a fin de Hydrographic surveyor formas tempranas de cncer colorrectal.  Los exmenes de rutina generalmente comienzan a los 38aos.  El examen directo  del colon se debe repetir cada 5 a 10aos hasta los 75aos. Sin embargo, es posible que se realicen exmenes con mayor frecuencia, si se detectan formas tempranas de plipos precancerosos o pequeos bultos. Cncer de piel  Revise la piel de la cabeza a los pies con regularidad.  Informe a su mdico si aparecen nuevos lunares o los que tiene se modifican, especialmente en su forma y color.  Tambin notifique al mdico si tiene un lunar que es ms grande que el tamao de una goma de lpiz.  Siempre use pantalla solar. Aplique pantalla solar de Kerry Dory y repetida a lo largo  del Training and development officer.  Protjase usando mangas y [employer], un sombrero de ala ancha y gafas para el sol, siempre que se encuentre en el exterior. ENFERMEDADES CARDACAS, DIABETES E HIPERTENSIN ARTERIAL  La hipertensin arterial causa enfermedades cardacas y [place] el riesgo de ictus. La hipertensin arterial es ms probable en los siguientes casos: ? Las personas que tienen la presin arterial en el extremo del rango normal (100-139/85-89 mm Hg). ? [employer] con sobrepeso u obesidad. ? Scientist, water quality.  Si usted tiene entre 18 y 39 aos, debe medirse la presin arterial cada 3 a 5 aos. Si usted tiene 40 aos o ms, debe medirse la presin arterial [employer]. Debe medirse la presin arterial dos veces: una vez cuando est en un hospital o una clnica y la otra vez cuando est en otro sitio. Registre el promedio de [employer]. Para controlar su presin arterial cuando no est en un hospital o [place], puede usar lo siguiente: ? Jorje Guild automtica para medir la presin arterial en una farmacia. ? Un monitor para medir la presin arterial en el hogar.  Si tiene entre 42 y 55 aos, consulte a su mdico si debe tomar aspirina para prevenir el ictus.  Realcese exmenes de deteccin de la diabetes con regularidad. Esto incluye la toma de [place] de sangre para controlar el nivel de azcar en la sangre durante el [place]. ? Si tiene un peso normal y un bajo riesgo de padecer diabetes, realcese este anlisis cada tres aos despus de los 45aos. ? Si tiene sobrepeso y un alto riesgo de padecer diabetes, considere someterse a este anlisis antes o con mayor frecuencia. PREVENCIN DE INFECCIONES HepatitisB  Si tiene un riesgo ms alto de Museum/gallery curator hepatitis B, debe someterse a un examen de deteccin de este virus. Se considera que tiene un alto riesgo de contraer hepatitis B si: ? Naci en un pas donde la hepatitis B es frecuente. Pregntele a su mdico qu pases  son considerados de Public affairs consultant. ? Sus padres nacieron en un pas de alto riesgo y usted no recibi una vacuna que lo proteja contra la hepatitis B (vacuna contra la hepatitis B). ? [place]. ? [place] agujas para inyectarse drogas. ? Vive con alguien que tiene hepatitis B. ? Ha tenido sexo con alguien que tiene hepatitis B. ? Recibe tratamiento de hemodilisis. ? Toma ciertos medicamentos para el cncer, trasplante de rganos y afecciones autoinmunitarias. Hepatitis C  Se recomienda un anlisis de [place] para: ? [employer] 1945 y 1965. ? Todas las personas que tengan un riesgo de haber contrado hepatitis C. Enfermedades de transmisin sexual (ETS).  Debe realizarse pruebas de deteccin de enfermedades de transmisin sexual (ETS), incluidas gonorrea y clamidia si: ? Es sexualmente activo y es menor de 60VPX. ? Es mayor de 24aos, y el mdico  le informa que corre riesgo de tener este tipo de infecciones. ? La actividad sexual ha cambiado desde que le hicieron la ltima prueba de deteccin y tiene un riesgo mayor de Best boy clamidia o Radio broadcast assistant. Pregntele al mdico si usted tiene riesgo.  Si no tiene el VIH, pero corre riesgo de infectarse por el virus, se recomienda tomar diariamente un medicamento recetado para evitar la infeccin. Esto se conoce como profilaxis previa a la exposicin. Se considera que est en riesgo si: ? Es Aut[place] Yore sexualmente y no [place] preservativos habitualmente o no conoce el estado del VIH de sus Advertising copywriter. ? Se inyecta drogas. ? Es Arthor Gorter sexualmente con Ardelia Mems pareja que tiene VIH. Consulte a su mdico para saber si tiene un alto riesgo de infectarse por el VIH. Si opta por comenzar la profilaxis previa a la exposicin, primero debe realizarse anlisis de deteccin del VIH. Luego, le harn anlisis cada 60mses mientras est tomando los medicamentos para la profilaxis previa a la exposicin. [facility] Si es premenopusica y puede quedar  e[place] solicite a su mdico asesoramiento previo a la concepcin.  Acido folico 1 mg daily.    Si desea evitar el embarazo, hable con su mdico sobre el control de la natalidad (anticoncepcin). OSTEOPOROSIS Y MENOPAUSIA  La osteoporosis es una enfermedad en la que los huesos pierden los minerales y la fuerza por el avance de la edad. El resultado pueden ser fracturas graves en los hUalapue El riesgo de osteoporosis puede identificarse con uArdelia Memsprueba de densidad sea.  Si tiene 65aos o ms, o si est en riesgo de sufrir osteoporosis y fracturas, pregunte a su mdico si debe someterse a exmenes.  Consulte a su mdico si debe tomar un suplemento de calcio o de vitamina D para reducir el riesgo de osteoporosis.  La menopausia puede presentar ciertos sntomas fsicos y rGaffer  La terapia de reemplazo hormonal puede reducir algunos de estos sntomas y rGaffer Consulte a su mdico para saber si la terapia de reemplazo hormonal es conveniente para usted. INSTRUCCIONES PARA EL CUIDADO EN EL HOGAR  Realcese los estudios de rutina de la salud, dentales y de lPublic librarian  MBuck[place]  No consuma ningn producto que contenga tabaco, lo que incluye cigarrillos, tabaco de mHigher education careers advisero cPsychologist, sport and exercise  Si est embarazada, no beba alcohol.  Si est amamantando, reduzca el consumo de alcohol y la frecuencia con la que consume.  Si es mujer y no est embarazada limite el consumo de alcohol a no ms de 1 medida por da. Una medida equivale a 12onzas de cerveza, 5onzas de vino o 1onzas de bebidas alcohlicas de alta graduacin.  No consuma drogas.  No comparta agujas.  Solicite ayuda a su mdico si necesita apoyo o informacin para abandonar las drogas.  Informe a su mdico si a menudo se siente deprimido.  Notifique a su mdico si alguna vez ha sido vctima de abuso o si no se siente seguro en su hogar. Esta informacin no tiene cMarine scientistel  consejo del mdico. Asegrese de hacerle al mdico cualquier pregunta que tenga. Document Released: 04/03/2011 Document Revised: 05/05/2014 Document Reviewed: 01/16/2015 Elsevier Interactive Patient Education  2018 [employer]   Please be sure medication list is accurate. If a new problem present, please set up appointment sooner than planned today.

## 2017-11-02 NOTE — Progress Notes (Signed)
HPI:   [name] is a 37 y.o. female, who is here today for her routine physical.  Last CPE: 2017  Regular exercise 3 or more time per week: Yes, she walks daily for 10 min and goes to the gyn 2 times per week. Following a healthy diet: Plenty of fruits but low in vegetables. She lives with her husband and their 2 children (59 and 80 yo)  Chronic medical problems: Otherwise healthy.   Pap smear 06/2017,reported as negative. She follows with gyn regularly. 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  She had mammogram at [facility] in 2018 because a cyst, s/p Bx and negative for malignancy.  No Hx of breast cancer, mother with uterine cancer.   There is no immunization history for the selected administration types on file for this patient. Tdap in 2012.   She has no concerns today.   Review of Systems  Constitutional: Negative for appetite change, fatigue, fever and unexpected weight change.  HENT: Negative for dental problem, hearing loss, nosebleeds, sore throat, trouble swallowing and voice change.   Eyes: Negative for redness and visual disturbance.  Respiratory: Negative for cough, shortness of breath and wheezing.   Cardiovascular: Negative for chest pain and leg swelling.  Gastrointestinal: Negative for abdominal pain, blood in stool, nausea and vomiting.       No changes in bowel habits.  Endocrine: Negative for cold intolerance, heat intolerance, polydipsia, polyphagia and polyuria.  Genitourinary: Negative for decreased urine volume, dysuria, hematuria, vaginal bleeding and vaginal discharge.  Musculoskeletal: Negative for gait problem and myalgias.  Skin: Negative for pallor and rash.  Allergic/Immunologic: Negative for environmental allergies.  Neurological: Negative for syncope, weakness and headaches.  Hematological: Negative for adenopathy. Does not bruise/bleed easily.  Psychiatric/Behavioral:  Negative for confusion and sleep disturbance. The patient is not nervous/anxious.   All other systems reviewed and are negative.     Current Outpatient Medications on File Prior to Visit  Medication Sig Dispense Refill  . Multiple Vitamins-Minerals (MULTIVITAMIN ADULT PO) Take by mouth daily.     No current facility-administered medications on file prior to visit.      Past Medical History:  Diagnosis Date  . HPV in female   . No pertinent past medical history     Past Surgical History:  Procedure Laterality Date  . BREAST BIOPSY Right   . NO PAST SURGERIES      No Known Allergies  Family History  Problem Relation Age of Onset  . Uterine cancer Mother   . Other Neg Hx     Social History   Socioeconomic History  . Marital status: Married    Spouse name: Not on file  . Number of children: 2  . Years of education: Not on file  . Highest education level: Not on file  Occupational History  . Not on file  Social Needs  . Financial resource strain: Not on file  . Food insecurity:    Worry: Not on file    Inability: Not on file  . Transportation needs:    Medical: Not on file    Non-medical: Not on file  Tobacco Use  . Smoking status: Never Smoker  . Smokeless tobacco: Never Used  Substance and Sexual Activity  . Alcohol use: No  . Drug use: No  . Sexual activity: Yes  Lifestyle  . Physical activity:    Days per week:  Not on file    Minutes per session: Not on file  . Stress: Not on file  Relationships  . Social connections:    Talks on phone: Not on file    Gets together: Not on file    Attends religious service: Not on file    Active member of club or organization: Not on file    Attends meetings of clubs or organizations: Not on file    Relationship status: Not on file  Other Topics Concern  . Not on file  Social History Narrative  . Not on file     Vitals:   11/02/17 1402  BP: 120/76  Pulse: 72  Resp: 12  Temp: 99 F (37.2 C)  SpO2:  96%   Body mass index is 24.07 kg/m.   Wt Readings from Last 3 Encounters:  11/02/17 140 lb 4 oz (63.6 kg)  11/11/16 142 lb (64.4 kg)  01/17/12 157 lb (71.2 kg)     Physical Exam  Nursing note and vitals reviewed. Constitutional: She is oriented to person, place, and time. She appears well-developed. No distress.  HENT:  Head: Normocephalic and atraumatic.  Right Ear: Hearing, tympanic membrane, external ear and ear canal normal.  Left Ear: Hearing, tympanic membrane, external ear and ear canal normal.  Mouth/Throat: Uvula is midline, oropharynx is clear and moist and mucous membranes are normal.  Eyes: Pupils are equal, round, and reactive to light. Conjunctivae and EOM are normal.  Neck: No tracheal deviation present. No thyromegaly present.  Cardiovascular: Normal rate and regular rhythm.  No murmur heard. Pulses:      Dorsalis pedis pulses are 2+ on the right side, and 2+ on the left side.  Respiratory: Effort normal and breath sounds normal. No respiratory distress.  GI: Soft. She exhibits no mass. There is no hepatomegaly. There is no tenderness.  Genitourinary:  Genitourinary Comments: Deferred to gynecologist.  Musculoskeletal: She exhibits no edema or tenderness.  No major deformity or signs of synovitis appreciated.  Lymphadenopathy:    She has no cervical adenopathy.       Right: No supraclavicular adenopathy present.       Left: No supraclavicular adenopathy present.  Neurological: She is alert and oriented to person, place, and time. She has normal strength. No cranial nerve deficit. Coordination and gait normal.  Reflex Scores:      Bicep reflexes are 2+ on the right side and 2+ on the left side.      Patellar reflexes are 2+ on the right side and 2+ on the left side. Skin: Skin is warm. No rash noted. No erythema.  Psychiatric: She has a normal mood and affect. Her speech is normal.  Well groomed, good eye contact.      ASSESSMENT AND PLAN:  [name] was here today annual physical examination.   Orders Placed This Encounter  Procedures  . Basic metabolic panel  . Lipid panel  . Hemoglobin A1c    Lab Results  Component Value Date   CREATININE 0.72 11/02/2017   BUN 10 11/02/2017   NA 137 11/02/2017   K 3.8 11/02/2017   CL 103 11/02/2017   CO2 27 11/02/2017   Lab Results  Component Value Date   HGBA1C 5.5 11/02/2017   Lab Results  Component Value Date   CHOL 159 11/02/2017   HDL 66.80 11/02/2017   LDLCALC 83 11/02/2017   TRIG 47.0 11/02/2017   CHOLHDL 2 11/02/2017     Routine general  medical examination at a health care facility  We discussed the importance of regular physical activity and healthy diet for prevention of chronic illness and/or complications. Preventive guidelines reviewed. Vaccination up to date. She will continue following with her gyn for her female preventive care. Next CPE in a year.   Diabetes mellitus screening -     Basic metabolic panel -     Hemoglobin A1c  Screening for lipid disorders -     Lipid panel     Return in 1 year (on 11/03/2018) for cpe.    Suanne Minahan G. [place], MD  [facility]. [place] office.

## 2018-04-29 ENCOUNTER — Encounter: Payer: Self-pay | Admitting: Family Medicine

## 2018-11-25 NOTE — Progress Notes (Signed)
HPI:   [name] is a 38 y.o. female, who is here today for her routine physical.  Last CPE: 11/02/17  Regular exercise 3 or more time per week: Daily exercise since COVID 19 pandemia. Following a healthy diet: Yes She lives with husband and 2 children.  Chronic medical problems: Otherwise healthy.  Pap smear 06/2017, per patient report. She follows with gyn. Nexplanon to be changed this year.  There is no immunization history for the selected administration types on file for this patient.  Mammogram: She had one in 2018 due to cyst,Bx negative for malignancy.  She has some concerns today.  Right breast new nodule 5 days ago. She usually checks her breast regularly. She denies tenderness, skin changes, or nipple discharge.  Mammogram in 2018 because lump,cyst found , Bx negative for malignancy.  Hernia, for years she has noted a bulging area or right inguinal area, there is no pain. She is not sure if problem is getting worse.   Review of Systems  Constitutional: Negative for appetite change, fatigue and fever.  HENT: Negative for hearing loss, mouth sores, trouble swallowing and voice change.   Eyes: Negative for photophobia and visual disturbance.  Respiratory: Negative for cough, shortness of breath and wheezing.   Cardiovascular: Negative for chest pain and leg swelling.  Gastrointestinal: Negative for abdominal pain, nausea and vomiting.       No changes in bowel habits.  Endocrine: Negative for cold intolerance and heat intolerance.  Genitourinary: Negative for decreased urine volume, dysuria, hematuria, vaginal bleeding and vaginal discharge.  Musculoskeletal: Negative for arthralgias, back pain and neck pain.  Skin: Negative for color change and rash.  Neurological: Negative for seizures, syncope, weakness, numbness and headaches.  Psychiatric/Behavioral: Negative for confusion and sleep disturbance. The patient is not nervous/anxious.   All  other systems reviewed and are negative.   Current Outpatient Medications on File Prior to Visit  Medication Sig Dispense Refill  . Multiple Vitamins-Minerals (MULTIVITAMIN ADULT PO) Take by mouth daily.     No current facility-administered medications on file prior to visit.      Past Medical History:  Diagnosis Date  . HPV in female   . No pertinent past medical history     Past Surgical History:  Procedure Laterality Date  . BREAST BIOPSY Right   . NO PAST SURGERIES      No Known Allergies  Family History  Problem Relation Age of Onset  . Uterine cancer Mother   . Other Neg Hx     Social History   Socioeconomic History  . Marital status: Married    Spouse name: Not on file  . Number of children: 2  . Years of education: Not on file  . Highest education level: Not on file  Occupational History  . Not on file  Social Needs  . Financial resource strain: Not on file  . Food insecurity    Worry: Not on file    Inability: Not on file  . Transportation needs    Medical: Not on file    Non-medical: Not on file  Tobacco Use  . Smoking status: Never Smoker  . Smokeless tobacco: Never Used  Substance and Sexual Activity  . Alcohol use: No  . Drug use: No  . Sexual activity: Yes  Lifestyle  . Physical activity    Days per week: Not on file    Minutes per session: Not on file  . Stress: Not on  file  Relationships  . Social Herbalist on phone: Not on file    Gets together: Not on file    Attends religious service: Not on file    Active member of club or organization: Not on file    Attends meetings of clubs or organizations: Not on file    Relationship status: Not on file  Other Topics Concern  . Not on file  Social History Narrative  . Not on file     Vitals:   11/26/18 0707  BP: 110/62  Pulse: 85  Resp: 12  Temp: 98.5 F (36.9 C)  SpO2: 98%   Body mass index is 21.73 kg/m.   Wt Readings from Last 3 Encounters:  11/26/18 126  lb 9.6 oz (57.4 kg)  11/02/17 140 lb 4 oz (63.6 kg)  11/11/16 142 lb (64.4 kg)     Physical Exam  Nursing note and vitals reviewed. Constitutional: She is oriented to person, place, and time. She appears well-developed and well-nourished. No distress.  HENT:  Head: Normocephalic and atraumatic.  Right Ear: Hearing, tympanic membrane, external ear and ear canal normal.  Left Ear: Hearing, tympanic membrane, external ear and ear canal normal.  Mouth/Throat: Uvula is midline, oropharynx is clear and moist and mucous membranes are normal.  Eyes: Pupils are equal, round, and reactive to light. Conjunctivae and EOM are normal.  Neck: No tracheal deviation present. No thyromegaly present.  Cardiovascular: Normal rate and regular rhythm.  No murmur heard. Pulses:      Dorsalis pedis pulses are 2+ on the right side and 2+ on the left side.  Respiratory: Effort normal and breath sounds normal. No respiratory distress.  GI: Soft. She exhibits no mass. There is no hepatomegaly. There is no abdominal tenderness. A hernia is present. Hernia confirmed positive in the right inguinal area.    Genitourinary:    Genitourinary Comments: Breast: Bilateral nodularity, R>L, fibrocystic changes. She points to lesion at 11 O'clock right above nipple, 1-1.5 cm,no tender. No skin changes or nipple discharge bilateral.   Musculoskeletal:        General: No edema.     Comments: No major deformity or signs of synovitis appreciated.  Lymphadenopathy:    She has no cervical adenopathy.       Right: No supraclavicular adenopathy present.       Left: No supraclavicular adenopathy present.  Neurological: She is alert and oriented to person, place, and time. She has normal strength. No cranial nerve deficit. Coordination and gait normal.  Reflex Scores:      Bicep reflexes are 2+ on the right side and 2+ on the left side.      Patellar reflexes are 2+ on the right side and 2+ on the left side. Skin: Skin is warm.  No rash noted. No erythema.  Psychiatric: She has a normal mood and affect. Cognition and memory are normal.  Well groomed, good eye contact.    ASSESSMENT AND PLAN:  [name] was here today annual physical examination.   Orders Placed This Encounter  Procedures  . Mammogram Digital Diagnostic Bilateral  . Ambulatory referral to General Surgery  . POCT glycosylated hemoglobin (Hb A1C)   Lab Results  Component Value Date   HGBA1C 5.1 11/26/2018   Routine general medical examination at a health care facility We discussed the importance of regular physical activity and healthy diet for prevention of chronic illness and/or complications. Preventive guidelines reviewed. Vaccination up to date.  Next CPE in a year.  Breast lump in female Because reporting small lesion as new,Dx mammogram ordered. Follow with gyn.  -     Mammogram Digital Diagnostic Bilateral; Future  Screening for endocrine, metabolic and immunity disorder -     POCT glycosylated hemoglobin (Hb A1C)  Unilateral inguinal hernia without obstruction or gangrene, recurrence not specified Instructed about warning signs.  -     Ambulatory referral to General Surgery   Return in 1 year (on 11/26/2019) for cpe.   Almedia Cordell G. Martin[place], MD  [facility] [place] office.

## 2018-11-26 ENCOUNTER — Ambulatory Visit (INDEPENDENT_AMBULATORY_CARE_PROVIDER_SITE_OTHER): Payer: BC Managed Care – PPO | Admitting: Family Medicine

## 2018-11-26 ENCOUNTER — Other Ambulatory Visit: Payer: Self-pay

## 2018-11-26 ENCOUNTER — Encounter: Payer: Self-pay | Admitting: Family Medicine

## 2018-11-26 VITALS — BP 110/62 | HR 85 | Temp 98.5°F | Resp 12 | Ht 64.0 in | Wt 126.6 lb

## 2018-11-26 DIAGNOSIS — Z1329 Encounter for screening for other suspected endocrine disorder: Secondary | ICD-10-CM | POA: Diagnosis not present

## 2018-11-26 DIAGNOSIS — K4090 Unilateral inguinal hernia, without obstruction or gangrene, not specified as recurrent: Secondary | ICD-10-CM

## 2018-11-26 DIAGNOSIS — Z13228 Encounter for screening for other metabolic disorders: Secondary | ICD-10-CM

## 2018-11-26 DIAGNOSIS — Z0000 Encounter for general adult medical examination without abnormal findings: Secondary | ICD-10-CM

## 2018-11-26 DIAGNOSIS — Z130 Encounter for screening for diseases of the blood and blood-forming organs and certain disorders involving the immune mechanism: Secondary | ICD-10-CM

## 2018-11-26 DIAGNOSIS — N63 Unspecified lump in unspecified breast: Secondary | ICD-10-CM | POA: Diagnosis not present

## 2018-11-26 LAB — POCT GLYCOSYLATED HEMOGLOBIN (HGB A1C): Hemoglobin A1C: 5.1 % (ref 4.0–5.6)

## 2018-11-26 NOTE — Patient Instructions (Addendum)
A few things to remember from today's visit:   Routine general medical examination at a health care facility  Breast lump in female - Plan: Mammogram Digital Diagnostic Bilateral  Screening for endocrine, metabolic and immunity disorder - Plan: POCT glycosylated hemoglobin (Hb A1C)  Unilateral inguinal hernia without obstruction or gangrene, recurrence not specified - Plan: Ambulatory referral to General Surgery   Today you have you routine preventive visit.  At least 150 minutes of moderate exercise per week, daily brisk walking for 15-30 min is a good exercise option. Healthy diet low in saturated (animal) fats and sweets and consisting of fresh fruits and vegetables, lean meats such as fish and white chicken and whole grains.  These are some of recommendations for screening depending of age and risk factors:   - Vaccines:  Tdap vaccine every 10 years.  Shingles vaccine recommended at age 90, could be given after 39 years of age but not sure about insurance coverage.   Pneumonia vaccines:  Prevnar 13 at 65 and Pneumovax at 12. Sometimes Pneumovax is giving earlier if history of smoking, lung disease,diabetes,kidney disease among some.    Screening for diabetes at age 49 and every 3 years.  Cervical cancer prevention:  Pap smear starts at 39 years of age and continues periodically until 39 years old in low risk women. Pap smear every 3 years between 24 and 47 years old. Pap smear every 3-5 years between women 28 and older if pap smear negative and HPV screening negative.   -Breast cancer: Mammogram: There is disagreement between experts about when to start screening in low risk asymptomatic female but recent recommendations are to start screening at 22 and not later than 39 years old , every 1-2 years and after 39 yo q 2 years. Screening is recommended until 40 years old but some women can continue screening depending of healthy issues.   Colon cancer screening: starts at 39  years old until 39 years old.  Cholesterol disorder screening at age 62 and every 3 years.  Also recommended:  1. Dental visit- Brush and floss your teeth twice daily; visit your dentist twice a year. 2. Eye doctor- Get an eye exam at least every 2 years. 3. Helmet use- Always wear a helmet when riding a bicycle, motorcycle, rollerblading or skateboarding. 4. Safe sex- If you may be exposed to sexually transmitted infections, use a condom. 5. Seat belts- Seat belts can save your live; always wear one. 6. Smoke/Carbon Monoxide detectors- These detectors need to be installed on the appropriate level of your home. Replace batteries at least once a year. 7. Skin cancer- When out in the sun please cover up and use sunscreen 15 SPF or higher. 8. Violence- If anyone is threatening or hurting you, please tell your healthcare provider.  9. Drink alcohol in moderation- Limit alcohol intake to one drink or less per day. Never drink and drive.

## 2018-12-01 ENCOUNTER — Encounter: Payer: Self-pay | Admitting: Family Medicine

## 2018-12-24 ENCOUNTER — Other Ambulatory Visit: Payer: Self-pay

## 2019-04-06 ENCOUNTER — Ambulatory Visit: Payer: Self-pay | Admitting: Surgery

## 2019-04-06 NOTE — H&P (Signed)
Surgical H&P  CC: hernia  HPI: This is a very pleasant 38 year old woman who is following up regarding a right inguinal hernia. We initially met in May 2018 regarding this concern. No significant change, and now is ready to proceed with surgery.   No Known Allergies  Past Medical History:  Diagnosis Date  . HPV in female   . No pertinent past medical history     Past Surgical History:  Procedure Laterality Date  . BREAST BIOPSY Right   . NO PAST SURGERIES      Family History  Problem Relation Age of Onset  . Uterine cancer Mother   . Other Neg Hx     Social History   Socioeconomic History  . Marital status: Married    Spouse name: Not on file  . Number of children: 2  . Years of education: Not on file  . Highest education level: Not on file  Occupational History  . Not on file  Social Needs  . Financial resource strain: Not on file  . Food insecurity    Worry: Not on file    Inability: Not on file  . Transportation needs    Medical: Not on file    Non-medical: Not on file  Tobacco Use  . Smoking status: Never Smoker  . Smokeless tobacco: Never Used  Substance and Sexual Activity  . Alcohol use: No  . Drug use: No  . Sexual activity: Yes  Lifestyle  . Physical activity    Days per week: Not on file    Minutes per session: Not on file  . Stress: Not on file  Relationships  . Social Herbalist on phone: Not on file    Gets together: Not on file    Attends religious service: Not on file    Active member of club or organization: Not on file    Attends meetings of clubs or organizations: Not on file    Relationship status: Not on file  Other Topics Concern  . Not on file  Social History Narrative  . Not on file    Current Outpatient Medications on File Prior to Visit  Medication Sig Dispense Refill  . Multiple Vitamins-Minerals (MULTIVITAMIN ADULT PO) Take by mouth daily.     No current facility-administered medications on file prior to  visit.     Review of Systems: a complete, 10pt review of systems was completed with pertinent positives and negatives as documented in the HPI  Physical Exam: Vitals Weight: 135.6 lb Height: 61in Body Surface Area: 1.6 m Body Mass Index: 25.62 kg/m  Temp.: 98.69F  Pulse: 77 (Regular)  BP: 115/80 (Sitting, Left Arm, Standard)  Alert and well-appearing Extraocular motions intact Unlabored respirations Abdomen soft and nontender, reducible right inguinal hernia No lower extremities edema   CBC Latest Ref Rng & Units 01/18/2012 01/17/2012 03/21/2009  WBC 4.0 - 10.5 K/uL 8.7 7.0 12.6(H)  Hemoglobin 12.0 - 15.0 g/dL 9.9(L) 11.3(L) 8.8 DELTA CHECK NOTED(L)  Hematocrit 36.0 - 46.0 % 29.6(L) 33.5(L) 25.9(L)  Platelets 150 - 400 K/uL 152 185 155 DELTA CHECK NOTED    CMP Latest Ref Rng & Units 11/02/2017 06/30/2008  Glucose 70 - 99 mg/dL 85 84  BUN 6 - 23 mg/dL 10 16  Creatinine 0.40 - 1.20 mg/dL 0.72 0.68  Sodium 135 - 145 mEq/L 137 139  Potassium 3.5 - 5.1 mEq/L 3.8 3.9  Chloride 96 - 112 mEq/L 103 103  CO2 19 - 32 mEq/L 27  24  Calcium 8.4 - 10.5 mg/dL 9.1 9.5  Total Protein 6.0 - 8.3 g/dL - 7.9  Total Bilirubin 0.3 - 1.2 mg/dL - 0.5  Alkaline Phos 39 - 117 units/L - 92  AST 0 - 37 units/L - 17  ALT 0 - 35 units/L - 16    No results found for: INR, PROTIME  Imaging: No results found.   A/P: INGUINAL HERNIA (K40.90) Story: Reducible and now minimally symptomatic. We again discussed the options for repair including open and laparoscopic. I recommended open repair for this unilateral, nonrecurrent right inguinal hernia. Discussed the technique, typical perioperative course, and risks of bleeding, infection, pain, scarring, injury to intra-abdominal or retroperitoneal structures, hernia recurrence, chronic pain, etc. Questions were welcomed and answered. She would like to schedule surgery in mid January.  Patient Active Problem List   Diagnosis Date Noted  . Vaginal  delivery (9/21) 01/17/2012  . Postpartum care following vaginal delivery (9/21) 01/17/2012  . DELAYED MENSES 08/03/2008       Romana Juniper, MD [facility]  See AMION to contact appropriate on-call provider

## 2019-06-23 ENCOUNTER — Ambulatory Visit: Payer: BC Managed Care – PPO | Attending: Internal Medicine

## 2019-06-23 DIAGNOSIS — Z23 Encounter for immunization: Secondary | ICD-10-CM | POA: Insufficient documentation

## 2019-06-23 NOTE — Progress Notes (Signed)
   Covid-19 Vaccination Clinic  Name:  Mehreen Guaman    MRN: [id number] DOB: [birth year]  06/23/2019  Ms. Adamaris Cash was observed post Covid-19 immunization for 15 minutes without incidence. She was provided with Vaccine Information Sheet and instruction to access the V-Safe system.   Ms. Beyonka Hiester was instructed to call 911 with any severe reactions post vaccine: Marland Kitchen Difficulty breathing  . Swelling of your face and throat  . A fast heartbeat  . A bad rash all over your body  . Dizziness and weakness    Immunizations Administered    Name Date Dose VIS Date Route   Pfizer COVID-19 Vaccine 06/23/2019  8:22 AM 0.3 mL 04/08/2019 Intramuscular   Manufacturer: [place]   Lot: [id number]   [place]: [id number]

## 2019-07-11 ENCOUNTER — Encounter (HOSPITAL_BASED_OUTPATIENT_CLINIC_OR_DEPARTMENT_OTHER): Payer: Self-pay | Admitting: Surgery

## 2019-07-11 ENCOUNTER — Other Ambulatory Visit: Payer: Self-pay

## 2019-07-11 NOTE — Progress Notes (Signed)
Spoke w/ via phone for pre-op interview---Sheila Kennedy needs dos---- urine poct             COVID test ------07-12-2019 at 1515 Arrive at -------530 am 07-15-2019 NPO after ------midnight Medications to take morning of surgery -----none Diabetic medication -----n/a Patient Special Instructions -----hibiclens shower hs and am of surgery Pre-Op special Istructions ----- Patient verbalized understanding of instructions that were given at this phone interview. Patient denies shortness of breath, chest pain, fever, cough a this phone interview.

## 2019-07-12 ENCOUNTER — Other Ambulatory Visit (HOSPITAL_COMMUNITY)
Admission: RE | Admit: 2019-07-12 | Discharge: 2019-07-12 | Disposition: A | Discharge status: Home / Self Care | Payer: BC Managed Care – PPO | Source: Ambulatory Visit | Attending: Surgery | Admitting: Surgery

## 2019-07-12 DIAGNOSIS — Z01812 Encounter for preprocedural laboratory examination: Secondary | ICD-10-CM | POA: Insufficient documentation

## 2019-07-12 DIAGNOSIS — Z20822 Contact with and (suspected) exposure to covid-19: Secondary | ICD-10-CM | POA: Insufficient documentation

## 2019-07-12 LAB — SARS CORONAVIRUS 2 (TAT 6-24 HRS): SARS Coronavirus 2: NEGATIVE

## 2019-07-12 NOTE — H&P (Signed)
Surgical H&P  CC: hernia  HPI: This is a very pleasant 39 year old woman who is following up regarding a right inguinal hernia. We initially met in May 2018 regarding this concern. No significant change, and now is ready to proceed with surgery.  No Known Allergies      Past Medical History:  Diagnosis Date  . HPV in female   . No pertinent past medical history         Past Surgical History:  Procedure Laterality Date  . BREAST BIOPSY Right   . NO PAST SURGERIES          Family History  Problem Relation Age of Onset  . Uterine cancer Mother   . Other Neg Hx    Social History        Socioeconomic History  . Marital status: Married    Spouse name: Not on file  . Number of children: 2  . Years of education: Not on file  . Highest education level: Not on file  Occupational History  . Not on file  Social Needs  . Financial resource strain: Not on file  . Food insecurity    Worry: Not on file    Inability: Not on file  . Transportation needs    Medical: Not on file    Non-medical: Not on file  Tobacco Use  . Smoking status: Never Smoker  . Smokeless tobacco: Never Used  Substance and Sexual Activity  . Alcohol use: No  . Drug use: No  . Sexual activity: Yes  Lifestyle  . Physical activity    Days per week: Not on file    Minutes per session: Not on file  . Stress: Not on file  Relationships  . Social Herbalist on phone: Not on file    Gets together: Not on file    Attends religious service: Not on file    Active member of club or organization: Not on file    Attends meetings of clubs or organizations: Not on file    Relationship status: Not on file  Other Topics Concern  . Not on file  Social History Narrative  . Not on file         Current Outpatient Medications on File Prior to Visit  Medication Sig Dispense Refill  . Multiple Vitamins-Minerals (MULTIVITAMIN ADULT PO) Take by mouth daily.     No current facility-administered medications on  file prior to visit.    Review of Systems: a complete, 10pt review of systems was completed with pertinent positives and negatives as documented in the HPI  Physical Exam:  Vitals  Weight: 135.6 lb Height: 61 in  Body Surface Area: 1.6 m Body Mass Index: 25.62 kg/m  Temp.: 98.1 F Pulse: 77 (Regular)  BP: 115/80 (Sitting, Left Arm, Standard)  Alert and well-appearing  Extraocular motions intact  Unlabored respirations  Abdomen soft and nontender, reducible right inguinal hernia  No lower extremities edema  CBC Latest Ref Rng & Units 01/18/2012 01/17/2012 03/21/2009  WBC 4.0 - 10.5 K/uL 8.7 7.0 12.6(H)  Hemoglobin 12.0 - 15.0 g/dL 9.9(L) 11.3(L) 8.8 DELTA CHECK NOTED(L)  Hematocrit 36.0 - 46.0 % 29.6(L) 33.5(L) 25.9(L)  Platelets 150 - 400 K/uL 152 185 155 DELTA CHECK NOTED   CMP Latest Ref Rng & Units 11/02/2017 06/30/2008  Glucose 70 - 99 mg/dL 85 84  BUN 6 - 23 mg/dL 10 16  Creatinine 0.40 - 1.20 mg/dL 0.72 0.68  Sodium 135 - 145 mEq/L  137 139  Potassium 3.5 - 5.1 mEq/L 3.8 3.9  Chloride 96 - 112 mEq/L 103 103  CO2 19 - 32 mEq/L 27 24  Calcium 8.4 - 10.5 mg/dL 9.1 9.5  Total Protein 6.0 - 8.3 g/dL - 7.9  Total Bilirubin 0.3 - 1.2 mg/dL - 0.5  Alkaline Phos 39 - 117 units/L - 92  AST 0 - 37 units/L - 17  ALT 0 - 35 units/L - 16   Recent Labs    Imaging:  Imaging Results (Last 48 hours)    A/P: INGUINAL HERNIA (K40.90)  Story: Reducible and now minimally symptomatic. We again discussed the options for repair including open and laparoscopic. I recommended open repair for this unilateral, nonrecurrent right inguinal hernia. Discussed the technique, typical perioperative course, and risks of bleeding, infection, pain, scarring, injury to intra-abdominal or retroperitoneal structures, hernia recurrence, chronic pain, etc. Questions were welcomed and answered. She would like to schedule surgery in mid January.      Patient Active Problem List   Diagnosis Date Noted  . Vaginal  delivery (9/21) 01/17/2012  . Postpartum care following vaginal delivery (9/21) 01/17/2012  . DELAYED MENSES 08/03/2008   Romana Juniper, MD  [facility]  See AMION to contact appropriate on-call provider

## 2019-07-14 NOTE — Anesthesia Preprocedure Evaluation (Addendum)
Anesthesia Evaluation  Patient identified by MRN, date of birth, ID band Patient awake    Reviewed: Allergy & Precautions, NPO status , Patient's Chart, lab work & pertinent test results  History of Anesthesia Complications Negative for: history of anesthetic complications  Airway Mallampati: II  TM Distance: >3 FB Neck ROM: Full    Dental no notable dental hx.    Pulmonary neg pulmonary ROS,    Pulmonary exam normal        Cardiovascular negative cardio ROS Normal cardiovascular exam     Neuro/Psych negative neurological ROS  negative psych ROS   GI/Hepatic Neg liver ROS, Inguinal hernia   Endo/Other  negative endocrine ROS  Renal/GU negative Renal ROS  negative genitourinary   Musculoskeletal negative musculoskeletal ROS (+)   Abdominal   Peds  Hematology negative hematology ROS (+)   Anesthesia Other Findings Day of surgery medications reviewed with patient.  Reproductive/Obstetrics negative OB ROS                            Anesthesia Physical Anesthesia Plan  ASA: I  Anesthesia Plan: General   Post-op Pain Management:    Induction: Intravenous  PONV Risk Score and Plan: 4 or greater and Treatment may vary due to age or medical condition, Ondansetron, Dexamethasone, Midazolam and Scopolamine patch - Pre-op  Airway Management Planned: LMA  Additional Equipment: None  Intra-op Plan:   Post-operative Plan: Extubation in OR  Informed Consent: I have reviewed the patients History and Physical, chart, labs and discussed the procedure including the risks, benefits and alternatives for the proposed anesthesia with the patient or authorized representative who has indicated his/her understanding and acceptance.     Dental advisory given  Plan Discussed with: CRNA  Anesthesia Plan Comments:        Anesthesia Quick Evaluation

## 2019-07-15 ENCOUNTER — Ambulatory Visit (HOSPITAL_BASED_OUTPATIENT_CLINIC_OR_DEPARTMENT_OTHER)
Admission: RE | Admit: 2019-07-15 | Discharge: 2019-07-15 | Disposition: A | Discharge status: Home / Self Care | Payer: BC Managed Care – PPO | Attending: Surgery | Admitting: Surgery

## 2019-07-15 ENCOUNTER — Ambulatory Visit (HOSPITAL_BASED_OUTPATIENT_CLINIC_OR_DEPARTMENT_OTHER): Payer: BC Managed Care – PPO | Admitting: Anesthesiology

## 2019-07-15 ENCOUNTER — Encounter (HOSPITAL_BASED_OUTPATIENT_CLINIC_OR_DEPARTMENT_OTHER)
Admission: RE | Disposition: A | Discharge status: Home / Self Care | Payer: Self-pay | Source: Home / Self Care | Attending: Surgery

## 2019-07-15 ENCOUNTER — Other Ambulatory Visit: Payer: Self-pay

## 2019-07-15 ENCOUNTER — Encounter (HOSPITAL_BASED_OUTPATIENT_CLINIC_OR_DEPARTMENT_OTHER): Payer: Self-pay | Admitting: Surgery

## 2019-07-15 DIAGNOSIS — D175 Benign lipomatous neoplasm of intra-abdominal organs: Secondary | ICD-10-CM | POA: Diagnosis not present

## 2019-07-15 DIAGNOSIS — K4090 Unilateral inguinal hernia, without obstruction or gangrene, not specified as recurrent: Secondary | ICD-10-CM | POA: Diagnosis present

## 2019-07-15 HISTORY — PX: INGUINAL HERNIA REPAIR: SHX194

## 2019-07-15 LAB — POCT PREGNANCY, URINE: Preg Test, Ur: NEGATIVE

## 2019-07-15 SURGERY — REPAIR, HERNIA, INGUINAL, ADULT
Anesthesia: General | Site: Abdomen | Laterality: Right

## 2019-07-15 MED ORDER — DEXAMETHASONE SODIUM PHOSPHATE 10 MG/ML IJ SOLN
INTRAMUSCULAR | Status: AC
  Filled 2019-07-15: qty 1

## 2019-07-15 MED ORDER — ACETAMINOPHEN 500 MG PO TABS
1000.0000 mg | ORAL_TABLET | ORAL | Status: AC
  Administered 2019-07-15: 1000 mg via ORAL
  Filled 2019-07-15: qty 2

## 2019-07-15 MED ORDER — CHLORHEXIDINE GLUCONATE 4 % EX LIQD
60.0000 mL | Freq: Once | CUTANEOUS | Status: DC
  Filled 2019-07-15: qty 118

## 2019-07-15 MED ORDER — ONDANSETRON HCL 4 MG/2ML IJ SOLN
INTRAMUSCULAR | Status: DC | PRN
  Administered 2019-07-15: 4 mg via INTRAVENOUS

## 2019-07-15 MED ORDER — ACETAMINOPHEN 500 MG PO TABS
1000.0000 mg | ORAL_TABLET | Freq: Once | ORAL | Status: AC
  Filled 2019-07-15: qty 2

## 2019-07-15 MED ORDER — SODIUM CHLORIDE 0.9% FLUSH
3.0000 mL | Freq: Two times a day (BID) | INTRAVENOUS | Status: DC
  Filled 2019-07-15: qty 3

## 2019-07-15 MED ORDER — DOCUSATE SODIUM 100 MG PO CAPS: 100.0000 mg | ORAL_CAPSULE | Freq: Two times a day (BID) | ORAL | 0 refills | Status: AC

## 2019-07-15 MED ORDER — CEFAZOLIN SODIUM-DEXTROSE 2-4 GM/100ML-% IV SOLN
2.0000 g | INTRAVENOUS | Status: AC
  Administered 2019-07-15: 2 g via INTRAVENOUS
  Filled 2019-07-15: qty 100

## 2019-07-15 MED ORDER — OXYCODONE HCL 5 MG PO TABS: 5.0000 mg | ORAL_TABLET | Freq: Three times a day (TID) | ORAL | 0 refills | Status: DC | PRN

## 2019-07-15 MED ORDER — LIDOCAINE 2% (20 MG/ML) 5 ML SYRINGE
INTRAMUSCULAR | Status: AC
  Filled 2019-07-15: qty 5

## 2019-07-15 MED ORDER — PROMETHAZINE HCL 25 MG/ML IJ SOLN
6.2500 mg | INTRAMUSCULAR | Status: DC | PRN
  Filled 2019-07-15: qty 1

## 2019-07-15 MED ORDER — MIDAZOLAM HCL 2 MG/2ML IJ SOLN
INTRAMUSCULAR | Status: AC
  Filled 2019-07-15: qty 2

## 2019-07-15 MED ORDER — SODIUM CHLORIDE 0.9% FLUSH
3.0000 mL | INTRAVENOUS | Status: DC | PRN
  Filled 2019-07-15: qty 3

## 2019-07-15 MED ORDER — OXYCODONE HCL 5 MG PO TABS
5.0000 mg | ORAL_TABLET | ORAL | Status: DC | PRN
  Filled 2019-07-15: qty 2

## 2019-07-15 MED ORDER — ACETAMINOPHEN 325 MG PO TABS
650.0000 mg | ORAL_TABLET | ORAL | Status: DC | PRN
  Filled 2019-07-15: qty 2

## 2019-07-15 MED ORDER — LACTATED RINGERS IV SOLN
INTRAVENOUS | Status: DC
  Administered 2019-07-15: 50 mL/h via INTRAVENOUS
  Filled 2019-07-15: qty 1000

## 2019-07-15 MED ORDER — BUPIVACAINE LIPOSOME 1.3 % IJ SUSP
INTRAMUSCULAR | Status: DC | PRN
  Administered 2019-07-15: 20 mL

## 2019-07-15 MED ORDER — FENTANYL CITRATE (PF) 100 MCG/2ML IJ SOLN
INTRAMUSCULAR | Status: AC
  Filled 2019-07-15: qty 2

## 2019-07-15 MED ORDER — CEFAZOLIN SODIUM-DEXTROSE 2-4 GM/100ML-% IV SOLN
INTRAVENOUS | Status: AC
  Filled 2019-07-15: qty 100

## 2019-07-15 MED ORDER — SODIUM CHLORIDE 0.9 % IR SOLN
Status: DC | PRN
  Administered 2019-07-15: 500 mL

## 2019-07-15 MED ORDER — MIDAZOLAM HCL 5 MG/5ML IJ SOLN
INTRAMUSCULAR | Status: DC | PRN
  Administered 2019-07-15: 2 mg via INTRAVENOUS

## 2019-07-15 MED ORDER — DEXAMETHASONE SODIUM PHOSPHATE 10 MG/ML IJ SOLN
INTRAMUSCULAR | Status: DC | PRN
  Administered 2019-07-15: 5 mg via INTRAVENOUS

## 2019-07-15 MED ORDER — KETOROLAC TROMETHAMINE 30 MG/ML IJ SOLN
INTRAMUSCULAR | Status: AC
  Filled 2019-07-15: qty 1

## 2019-07-15 MED ORDER — GABAPENTIN 300 MG PO CAPS
ORAL_CAPSULE | ORAL | Status: AC
  Filled 2019-07-15: qty 1

## 2019-07-15 MED ORDER — SCOPOLAMINE 1 MG/3DAYS TD PT72
MEDICATED_PATCH | TRANSDERMAL | Status: AC
  Filled 2019-07-15: qty 1

## 2019-07-15 MED ORDER — ACETAMINOPHEN 650 MG RE SUPP
650.0000 mg | RECTAL | Status: DC | PRN
  Filled 2019-07-15: qty 1

## 2019-07-15 MED ORDER — FENTANYL CITRATE (PF) 100 MCG/2ML IJ SOLN
INTRAMUSCULAR | Status: DC | PRN
  Administered 2019-07-15 (×4): 25 ug via INTRAVENOUS

## 2019-07-15 MED ORDER — ACETAMINOPHEN 500 MG PO TABS
ORAL_TABLET | ORAL | Status: AC
  Filled 2019-07-15: qty 2

## 2019-07-15 MED ORDER — PROPOFOL 10 MG/ML IV BOLUS
INTRAVENOUS | Status: DC | PRN
  Administered 2019-07-15: 120 mg via INTRAVENOUS

## 2019-07-15 MED ORDER — BUPIVACAINE LIPOSOME 1.3 % IJ SUSP
20.0000 mL | Freq: Once | INTRAMUSCULAR | Status: DC
  Filled 2019-07-15: qty 20

## 2019-07-15 MED ORDER — ONDANSETRON HCL 4 MG/2ML IJ SOLN
INTRAMUSCULAR | Status: AC
  Filled 2019-07-15: qty 2

## 2019-07-15 MED ORDER — SCOPOLAMINE 1 MG/3DAYS TD PT72
1.0000 | MEDICATED_PATCH | Freq: Once | TRANSDERMAL | Status: DC
  Administered 2019-07-15: 1.5 mg via TRANSDERMAL
  Filled 2019-07-15: qty 1

## 2019-07-15 MED ORDER — SODIUM CHLORIDE 0.9 % IV SOLN
250.0000 mL | INTRAVENOUS | Status: DC | PRN
  Filled 2019-07-15: qty 250

## 2019-07-15 MED ORDER — FENTANYL CITRATE (PF) 100 MCG/2ML IJ SOLN
25.0000 ug | INTRAMUSCULAR | Status: DC | PRN
  Filled 2019-07-15: qty 1

## 2019-07-15 MED ORDER — GABAPENTIN 300 MG PO CAPS
300.0000 mg | ORAL_CAPSULE | ORAL | Status: AC
  Administered 2019-07-15: 300 mg via ORAL
  Filled 2019-07-15: qty 1

## 2019-07-15 MED ORDER — BUPIVACAINE-EPINEPHRINE 0.5% -1:200000 IJ SOLN
INTRAMUSCULAR | Status: DC | PRN
  Administered 2019-07-15: 30 mL

## 2019-07-15 MED ORDER — OXYCODONE HCL 5 MG PO TABS
5.0000 mg | ORAL_TABLET | Freq: Once | ORAL | Status: DC | PRN
  Filled 2019-07-15: qty 1

## 2019-07-15 MED ORDER — LIDOCAINE 2% (20 MG/ML) 5 ML SYRINGE
INTRAMUSCULAR | Status: DC | PRN
  Administered 2019-07-15: 60 mg via INTRAVENOUS

## 2019-07-15 MED ORDER — OXYCODONE HCL 5 MG/5ML PO SOLN
5.0000 mg | Freq: Once | ORAL | Status: DC | PRN
  Filled 2019-07-15: qty 5

## 2019-07-15 MED ORDER — KETOROLAC TROMETHAMINE 30 MG/ML IJ SOLN
INTRAMUSCULAR | Status: DC | PRN
  Administered 2019-07-15: 30 mg via INTRAVENOUS

## 2019-07-15 MED ORDER — PROPOFOL 500 MG/50ML IV EMUL
INTRAVENOUS | Status: AC
  Filled 2019-07-15: qty 100

## 2019-07-15 MED ORDER — PROPOFOL 10 MG/ML IV BOLUS
INTRAVENOUS | Status: AC
  Filled 2019-07-15: qty 40

## 2019-07-15 SURGICAL SUPPLY — 47 items
APL PRP STRL LF DISP 70% ISPRP (MISCELLANEOUS) ×1
APL SKNCLS STERI-STRIP NONHPOA (GAUZE/BANDAGES/DRESSINGS) ×1
BENZOIN TINCTURE PRP APPL 2/3 (GAUZE/BANDAGES/DRESSINGS) ×3 IMPLANT
BLADE HEX COATED 2.75 (ELECTRODE) ×3 IMPLANT
BLADE SURG 15 STRL LF DISP TIS (BLADE) ×2 IMPLANT
BLADE SURG 15 STRL SS (BLADE) ×6
CHLORAPREP W/TINT 26 (MISCELLANEOUS) ×3 IMPLANT
CLOSURE WOUND 1/2 X4 (GAUZE/BANDAGES/DRESSINGS) ×1
COVER BACK TABLE 60X90IN (DRAPES) ×3 IMPLANT
COVER MAYO STAND STRL (DRAPES) ×3 IMPLANT
COVER WAND RF STERILE (DRAPES) ×3 IMPLANT
DECANTER SPIKE VIAL GLASS SM (MISCELLANEOUS) IMPLANT
DRAIN PENROSE 18X1/2 LTX STRL (DRAIN) ×3 IMPLANT
DRAPE LAPAROTOMY TRNSV 102X78 (DRAPES) ×3 IMPLANT
DRAPE UTILITY XL STRL (DRAPES) ×3 IMPLANT
DRSG TEGADERM 4X4.75 (GAUZE/BANDAGES/DRESSINGS) ×4 IMPLANT
GAUZE SPONGE 4X4 12PLY STRL (GAUZE/BANDAGES/DRESSINGS) ×3 IMPLANT
GAUZE SPONGE 4X4 12PLY STRL LF (GAUZE/BANDAGES/DRESSINGS) ×2 IMPLANT
GLOVE BIO SURGEON STRL SZ 6 (GLOVE) ×3 IMPLANT
GLOVE BIOGEL PI IND STRL 6.5 (GLOVE) ×1 IMPLANT
GLOVE BIOGEL PI INDICATOR 6.5 (GLOVE) ×4
GOWN STRL REUS W/TWL LRG LVL3 (GOWN DISPOSABLE) ×5 IMPLANT
MESH ULTRAPRO 3X6 7.6X15CM (Mesh General) ×2 IMPLANT
NEEDLE HYPO 22GX1.5 SAFETY (NEEDLE) ×3 IMPLANT
PACK BASIN DAY SURGERY FS (CUSTOM PROCEDURE TRAY) ×3 IMPLANT
PENCIL BUTTON HOLSTER BLD 10FT (ELECTRODE) ×3 IMPLANT
SPONGE LAP 4X18 RFD (DISPOSABLE) ×1 IMPLANT
STRIP CLOSURE SKIN 1/2X4 (GAUZE/BANDAGES/DRESSINGS) ×2 IMPLANT
SUT ETHIBOND 0 (SUTURE) IMPLANT
SUT ETHIBOND 0 MO6 C/R (SUTURE) ×3 IMPLANT
SUT MNCRL AB 4-0 PS2 18 (SUTURE) ×3 IMPLANT
SUT PDS AB 2-0 CT2 27 (SUTURE) IMPLANT
SUT PROLENE 2 0 CT2 30 (SUTURE) IMPLANT
SUT VIC AB 0 CT1 27 (SUTURE) ×3
SUT VIC AB 0 CT1 27XBRD ANBCTR (SUTURE) ×1 IMPLANT
SUT VIC AB 3-0 SH 27 (SUTURE) ×6
SUT VIC AB 3-0 SH 27X BRD (SUTURE) ×2 IMPLANT
SUT VICRYL AB 3 0 TIES (SUTURE) ×3 IMPLANT
SYR 10ML LL (SYRINGE) ×3 IMPLANT
SYR BULB IRRIGATION 50ML (SYRINGE) ×3 IMPLANT
SYR CONTROL 10ML LL (SYRINGE) ×3 IMPLANT
TAPE STRIPS DRAPE STRL (GAUZE/BANDAGES/DRESSINGS) ×2 IMPLANT
TOWEL OR 17X26 10 PK STRL BLUE (TOWEL DISPOSABLE) ×6 IMPLANT
TRAY CATH 16FR W/PLASTIC CATH (SET/KITS/TRAYS/PACK) ×2 IMPLANT
TRAY FOL W/BAG SLVR 16FR STRL (SET/KITS/TRAYS/PACK) ×1 IMPLANT
TRAY FOLEY W/BAG SLVR 16FR LF (SET/KITS/TRAYS/PACK) ×3
YANKAUER SUCT BULB TIP NO VENT (SUCTIONS) IMPLANT

## 2019-07-15 NOTE — Anesthesia Postprocedure Evaluation (Signed)
Anesthesia Post Note  Patient: Sheila Kennedy  Procedure(s) Performed: OPEN RIGHT INGUINAL HERNIA REPAIR (Right Abdomen)     Patient location during evaluation: PACU Anesthesia Type: General Level of consciousness: awake and alert and oriented Pain management: pain level controlled Vital Signs Assessment: post-procedure vital signs reviewed and stable Respiratory status: spontaneous breathing, nonlabored ventilation and respiratory function stable Cardiovascular status: blood pressure returned to baseline Postop Assessment: no apparent nausea or vomiting Anesthetic complications: no    Last Vitals:  Vitals:   07/15/19 0850 07/15/19 0900  BP: 111/64 109/63  Pulse: 94 87  Resp: 14 12  Temp: 36.8 C   SpO2: 100% 100%    Last Pain:  Vitals:   07/15/19 0850  TempSrc:   PainSc: 0-No pain                 Brennan Bailey

## 2019-07-15 NOTE — Transfer of Care (Signed)
Immediate Anesthesia Transfer of Care Note  Patient: Sheila Kennedy  Procedure(s) Performed: OPEN RIGHT INGUINAL HERNIA REPAIR (Right Abdomen)  Patient Location: PACU  Anesthesia Type:General  Level of Consciousness: awake, alert , oriented and patient cooperative  Airway & Oxygen Therapy: Patient Spontanous Breathing and Patient connected to nasal cannula oxygen  Post-op Assessment: Report given to RN and Post -op Vital signs reviewed and stable  Post vital signs: Reviewed and stable  Last Vitals:  Vitals Value Taken Time  BP 111/64 07/15/19 0851  Temp    Pulse 94 07/15/19 0852  Resp 14 07/15/19 0852  SpO2 100 % 07/15/19 0852  Vitals shown include unvalidated device data.  Last Pain:  Vitals:   07/15/19 0616  TempSrc: Oral  PainSc: 0-No pain      Patients Stated Pain Goal: 5 ([id number])  Complications: No apparent anesthesia complications

## 2019-07-15 NOTE — Anesthesia Procedure Notes (Signed)
Procedure Name: LMA Insertion Date/Time: 07/15/2019 7:21 AM Performed by: Lollie Sails, CRNA Pre-anesthesia Checklist: Patient identified, Emergency Drugs available, Suction available, Patient being monitored and Timeout performed Patient Re-evaluated:Patient Re-evaluated prior to induction Oxygen Delivery Method: Circle system utilized Preoxygenation: Pre-oxygenation with 100% oxygen Induction Type: IV induction Ventilation: Mask ventilation without difficulty LMA: LMA inserted LMA Size: 4.0 Number of attempts: 1 Placement Confirmation: positive ETCO2 and breath sounds checked- equal and bilateral Tube secured with: Tape Dental Injury: Teeth and Oropharynx as per pre-operative assessment

## 2019-07-15 NOTE — Discharge Instructions (Addendum)
HERNIA REPAIR: POST OP INSTRUCTIONS  ######################################################################  EAT Gradually transition to a high fiber diet with a fiber supplement over the next few weeks after discharge.  Start with a pureed / full liquid diet (see below)  WALK Walk an hour a day.  Control your pain to do that.    CONTROL PAIN Control pain so that you can walk, sleep, tolerate sneezing/coughing, and go up/down stairs.  HAVE A BOWEL MOVEMENT DAILY Keep your bowels regular to avoid problems.  OK to try a laxative to override constipation.  OK to use an antidairrheal to slow down diarrhea.  Call if not better after 2 tries  CALL IF YOU HAVE PROBLEMS/CONCERNS Call if you are still struggling despite following these instructions. Call if you have concerns not answered by these instructions  ######################################################################    1. DIET: Follow a light bland diet & liquids the first 24 hours after arrival home, such as soup, liquids, starches, etc.  Be sure to drink plenty of fluids.  Quickly advance to a usual solid diet within a few days.  Avoid fast food or heavy meals as your are more likely to get nauseated or have irregular bowels.  A low-sugar, high-fiber diet for the rest of your life is ideal.   2. Take your usually prescribed home medications unless otherwise directed.  3. PAIN CONTROL: a. Pain is best controlled by a usual combination of three different methods TOGETHER: i. Ice/Heat ii. Over the counter pain medication iii. Prescription pain medication b. Most patients will experience some swelling and bruising around the hernia(s) such as the bellybutton, groins, or old incisions.  Ice packs or heating pads (30-60 minutes up to 6 times a day) will help. Use ice for the first few days to help decrease swelling and bruising, then switch to heat to help relax tight/sore spots and speed recovery.  Some people prefer to use ice  alone, heat alone, alternating between ice & heat.  Experiment to what works for you.  Swelling and bruising can take several weeks to resolve.   c. It is helpful to take an over-the-counter pain medication regularly for the first few days: i. Naproxen (Aleve, etc)  Two 220mg  tabs twice a day OR Ibuprofen (Advil, etc) Three 200mg  tabs four times a day (every meal & bedtime) AND ii. Acetaminophen (Tylenol, etc) 325-650mg  four times a day (every meal & bedtime) d. A  prescription for pain medication should be given to you upon discharge.  Take your pain medication as prescribed, if needed.  i. If you are having problems/concerns with the prescription medicine (does not control pain, nausea, vomiting, rash, itching, etc), please call us [phone] to see if we need to switch you to a different pain medicine that will work better for you and/or control your side effect better. ii. If you need a refill on your pain medication, please contact your pharmacy.  They will contact our office to request authorization. Prescriptions will not be filled after 5 pm or on week-ends.  4. Avoid getting constipated.  Between the surgery and the pain medications, it is common to experience some constipation.  Increasing fluid intake and taking a fiber supplement (such as Metamucil, Citrucel, FiberCon, MiraLax, etc) 1-2 times a day regularly will usually help prevent this problem from occurring.  A mild laxative (prune juice, Milk of Magnesia, MiraLax, etc) should be taken according to package directions if there are no bowel movements after 48 hours.    5. Wash / shower  every day, starting 2 days after surgery.  You may shower over the steri strips as they are waterproof.  No soaking, swimming, or submerging until incision is completely healed. No rubbing, scrubbing, lotions or ointments to incision.  6. Remove your outer bandage2 days after surgery. Steri strips will peel off after about 1 week. You may leave the  incisions open to air.  You may replace a dressing/Band-Aid to cover an incision for comfort if you wish.    7. ACTIVITIES as tolerated:   a. You may resume regular (light) daily activities--such as daily self-care, walking, climbing stairs--gradually increasing activities as tolerated.  Control your pain so that you can walk an hour a day.  If you can walk 30 minutes without difficulty, it is safe to try more intense activity such as jogging, treadmill, bicycling, low-impact aerobics, swimming, etc. b. Refrain from the most intensive and strenuous activity such as sit-ups, heavy lifting, contact sports, etc  Refrain from any heavy lifting or straining until 6 weeks after surgery.   c. DO NOT PUSH THROUGH PAIN.  Let pain be your guide: If it hurts to do something, don't do it.  Pain is your body warning you to avoid that activity for another week until the pain goes down. d. You may drive when you are no longer taking prescription pain medication, you can comfortably wear a seatbelt, and you can safely maneuver your car and apply brakes. e. Dennis Bast may have sexual intercourse when it is comfortable.   8. FOLLOW UP in our office a. Please call [facility] at [phone] to set up an appointment to see your surgeon in the office for a follow-up appointment approximately 2-3 weeks after your surgery. b. Make sure that you call for this appointment the day you arrive home to insure a convenient appointment time.  9.  If you have disability of FMLA / Family leave forms, please bring the forms to the office for processing.  (do not give to your surgeon).  WHEN TO CALL us [phone]: 1. Poor pain control 2. Reactions / problems with new medications (rash/itching, nausea, etc)  3. Fever over 101.5 F (38.5 C) 4. Inability to urinate 5. Nausea and/or vomiting 6. Worsening swelling or bruising 7. Continued bleeding from incision. 8. Increased pain, redness, or drainage from the incision   The clinic staff  is available to answer your questions during regular business hours (8:30am-5pm).  Please don't hesitate to call and ask to speak to one of our nurses for clinical concerns.   If you have a medical emergency, go to the nearest emergency room or call 911.  A surgeon from [facility] is always on call at the hospitals in [facility], [place], [place], [place], [place]  [address] ?  [address] [address], [address], [place]   [address] MAIN: [phone] ? TOLL FREE: [phone] ? FAX: [phone] [email or web address]    Post Anesthesia Home Care Instructions  Activity: Get plenty of rest for the remainder of the day. A responsible individual must stay with you for 24 hours following the procedure.  For the next 24 hours, DO NOT: -Drive a car -Paediatric nurse -Drink alcoholic beverages -Take any medication unless instructed by your physician -Make any legal decisions or sign important papers.  Meals: Start with liquid foods such as gelatin or soup. Progress to regular foods as tolerated. Avoid greasy, spicy, heavy foods. If nausea and/or vomiting occur, drink only clear liquids  until the nausea and/or vomiting subsides. Call your physician if vomiting continues.  Special Instructions/Symptoms: Your throat may feel dry or sore from the anesthesia or the breathing tube placed in your throat during surgery. If this causes discomfort, gargle with warm salt water. The discomfort should disappear within 24 hours.  If you had a scopolamine patch placed behind your ear for the management of post- operative nausea and/or vomiting:  1. The medication in the patch is effective for 72 hours, after which it should be removed.  Wrap patch in a tissue and discard in the trash. Wash hands thoroughly with soap and water. 2. You may remove the patch earlier than 72 hours if you experience unpleasant side effects which may include dry mouth,  dizziness or visual disturbances. 3. Avoid touching the patch. Wash your hands with soap and water after contact with the patch.    Information for Discharge Teaching: EXPAREL (bupivacaine liposome injectable suspension)   Your surgeon or anesthesiologist gave you EXPAREL(bupivacaine) to help control your pain after surgery.   EXPAREL is a local anesthetic that provides pain relief by numbing the tissue around the surgical site.  EXPAREL is designed to release pain medication over time and can control pain for up to 72 hours.  Depending on how you respond to EXPAREL, you may require less pain medication during your recovery.  Possible side effects:  Temporary loss of sensation or ability to move in the area where bupivacaine was injected.  Nausea, vomiting, constipation  Rarely, numbness and tingling in your mouth or lips, lightheadedness, or anxiety may occur.  Call your doctor right away if you think you may be experiencing any of these sensations, or if you have other questions regarding possible side effects.  Follow all other discharge instructions given to you by your surgeon or nurse. Eat a healthy diet and drink plenty of water or other fluids.  If you return to the hospital for any reason within 96 hours following the administration of EXPAREL, it is important for health care providers to know that you have received this anesthetic. A teal colored band has been placed on your arm with the date, time and amount of EXPAREL you have received in order to alert and inform your health care providers. Please leave this armband in place for the full 96 hours following administration, and then you may remove the band.

## 2019-07-15 NOTE — Op Note (Signed)
Operative Note  Sheila Kennedy  [id number]  [id number]  07/15/2019   Surgeon: Clovis Riley MD FACS   Assistant: OR staff   Procedure performed: Open right inguinal hernia repair with mesh, excision of retroperitoneal lipoma   Preop diagnosis:  right inguinal hernia   Post-op diagnosis/intraop findings: small indirect hernia sac, moderate retroperitoneal lipoma   Specimens: none   EBL: 5cc   Complications: none   Description of procedure: After obtaining informed consent, the patient was taken to the operating room and placed supine on operating room table where general anesthesia was initiated, preoperative antibiotics were administered, SCDs applied, and a formal timeout was performed. Foley catheter inserted which is removed at the end of the case. The groin was clipped, prepped and draped in the usual sterile fashion. An oblique incision was made in the just above the inguinal ligament after infiltrating the tissues with local anesthetic (Exparel mixed with 0.5% bupivacaine with epi). Soft tissues were dissected using electrocautery until the external oblique aponeurosis was encountered. This was divided sharply to expand the external ring. A plane was bluntly developed beneath the external oblique. The ilioinguinal nerve was identified, divided between hemostats and each end ligated with 3-0 vicryl ties. The round ligament/ hernia contents were then bluntly dissected away from the pubic tubercle and encircled with a Penrose. Inspection of the inguinal anatomy revealed a moderate lipoma and a very small indirect sac which was adherent to the round ligament.   The lipoma was carefully dissected down to the level of the internal ring and carefully inspected to confirm anatomy.  This was excised at the internal ring and the pedicle ligated with a 3-0 Vicryl tie.  The round ligament was divided between hemostats and each end ligated with 3-0 Vicryl ties.  The indirect hernia sac was  very small and thin and this was reduced with the proximal round ligament into the abdomen.  The inguinal floor was then reapproximated, occluding the internal ring, with interrupted figure-of-eight sutures of 0 Vicryl.  A 3 x 6 piece of ultra Pro mesh was brought onto the field and trimmed to approximate the field. This was tacked to the pubic tubercle fascia using 0 ethibond. Interrupted 0 ethibonds were then used to tack the mesh to the inferior shelving edge and to the internal oblique superiorly.  The lateral aspect of the mesh was directed out laterally to lie flat beneath the external oblique aponeurosis.Marland Kitchen Hemostasis was ensured within the wound. The external oblique aponeurosis was reapproximated with a running 3-0 Vicryl to re-create a narrowed external ring. More local was infiltrated around the pubic tubercle and in the plane just below the external oblique. The Scarpa's was reapproximated with interrupted 3-0 Vicryls. The skin was closed with a running subcuticular Monocryl. The remainder of the local was injected in the subcutaneous and subcuticular space. The field was then cleaned, benzoin and Steri-Strips and sterile bandage were applied. The patient was then awakened extubated and taken to PACU in stable condition.    All counts were correct at the completion of the case

## 2019-07-15 NOTE — Interval H&P Note (Signed)
History and Physical Interval Note:  07/15/2019 7:08 AM  Curtice  has presented today for surgery, with the diagnosis of INGUINAL HERNIA.  The various methods of treatment have been discussed with the patient and family. After consideration of risks, benefits and other options for treatment, the patient has consented to  Procedure(s): OPEN RIGHT INGUINAL HERNIA REPAIR (Right) as a surgical intervention.  The patient's history has been reviewed, patient examined, no change in status, stable for surgery.  I have reviewed the patient's chart and labs.  Questions were answered to the patient's satisfaction.     Dakota Vanwart Rich Brave

## 2019-07-19 ENCOUNTER — Ambulatory Visit: Payer: BC Managed Care – PPO | Attending: Internal Medicine

## 2019-07-19 DIAGNOSIS — Z23 Encounter for immunization: Secondary | ICD-10-CM

## 2019-07-19 NOTE — Progress Notes (Signed)
   Covid-19 Vaccination Clinic  Name:  Sheila Kennedy    MRN: [id number] DOB: July 17, 1980  07/19/2019  [name] was observed post Covid-19 immunization for 15 minutes without incident. She was provided with Vaccine Information Sheet and instruction to access the V-Safe system.   [name] was instructed to call 911 with any severe reactions post vaccine: Marland Kitchen Difficulty breathing  . Swelling of face and throat  . A fast heartbeat  . A bad rash all over body  . Dizziness and weakness   Immunizations Administered    Name Date Dose VIS Date Route   Pfizer COVID-19 Vaccine 07/19/2019  8:37 AM 0.3 mL 04/08/2019 Intramuscular   Manufacturer: [place]   Lot: [id number]   [place]: [id number]

## 2019-11-27 NOTE — Progress Notes (Signed)
HPI: Sheila Kennedy is a 39 y.o. female, who is here today for her routine physical.  Last CPE: 11/26/2018.  Regular exercise 3 or more time per week: She has not been exercising as intense as she has done since 06/2019. She is walking daily. She had right inguinal hernia repair on 07/15/2019.  Planning on starting routine exercise in 12/2019. Following a healthy diet: Yes She lives with her husband and 2 children.  Chronic medical problems: Otherwise healthy. No hx of DM,HTN,HLD,or tobacco use.  Pap smear:11/16/19, result is pending. Follows with gyn annually.  Immunization History  Administered Date(s) Administered  . Influenza-Unspecified 01/16/2017, 03/02/2018  . PFIZER SARS-COV-2 Vaccination 06/23/2019, 07/19/2019   Mammogram: N/A Colonoscopy: N/A DEXA: N/A Hep C screening: Never.  She has no concerns today.  Review of Systems  Constitutional: Negative for appetite change, fatigue and fever.  HENT: Negative for dental problem, hearing loss, mouth sores and sore throat.   Eyes: Negative for redness and visual disturbance.  Respiratory: Negative for cough, shortness of breath and wheezing.   Cardiovascular: Negative for chest pain and leg swelling.  Gastrointestinal: Negative for abdominal pain, nausea and vomiting.       No changes in bowel habits.  Endocrine: Negative for cold intolerance, heat intolerance, polydipsia, polyphagia and polyuria.  Genitourinary: Negative for decreased urine volume, dysuria, hematuria, vaginal bleeding and vaginal discharge.  Musculoskeletal: Negative for gait problem and myalgias.  Skin: Negative for color change and rash.  Allergic/Immunologic: Negative for environmental allergies.  Neurological: Negative for syncope, weakness and headaches.  Hematological: Negative for adenopathy. Does not bruise/bleed easily.  Psychiatric/Behavioral: Negative for behavioral problems and confusion. The patient is not nervous/anxious.   All  other systems reviewed and are negative.  Current Outpatient Medications on File Prior to Visit  Medication Sig Dispense Refill  . etonogestrel (NEXPLANON) 68 MG IMPL implant 1 each by Subdermal route once. Inserted 2020    . Multiple Vitamins-Minerals (MULTIVITAMIN ADULT PO) Take by mouth daily.     No current facility-administered medications on file prior to visit.   Past Medical History:  Diagnosis Date  . 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 No pertinent past medical history     Past Surgical History:  Procedure Laterality Date  . BREAST BIOPSY Right 2019   benign  . INGUINAL HERNIA REPAIR Right 07/15/2019   Procedure: OPEN RIGHT INGUINAL HERNIA REPAIR;  Surgeon: Clovis Riley, MD;  Location: [place];  Service: General;  Laterality: Right;   No Known Allergies  Family History  Problem Relation Age of Onset  . Uterine cancer Mother   . Other Neg Hx     Social History   Socioeconomic History  . Marital status: Married    Spouse name: Not on file  . Number of children: 2  . Years of education: Not on file  . Highest education level: Not on file  Occupational History  . Not on file  Tobacco Use  . Smoking status: Never Smoker  . Smokeless tobacco: Never Used  Vaping Use  . Vaping Use: Never used  Substance and Sexual Activity  . Alcohol use: No  . Drug use: No  . Sexual activity: Yes  Other Topics Concern  . Not on file  Social History Narrative  . Not on file   Social Determinants of Health   Financial Resource Strain:   . Difficulty of Paying Living  Expenses:   Food Insecurity:   . Worried About Charity fundraiser in the Last Year:   . Arboriculturist in the Last Year:   Transportation Needs:   . Film/video editor (Medical):   Marland Kitchen Lack of Transportation (Non-Medical):   Physical Activity:   . Days of Exercise per Week:   . Minutes of Exercise per Session:   Stress:   . Feeling of Stress :    Social Connections:   . Frequency of Communication with Friends and Family:   . Frequency of Social Gatherings with Friends and Family:   . Attends Religious Services:   . Active Member of Clubs or Organizations:   . Attends Archivist Meetings:   Marland Kitchen Marital Status:    Vitals:   11/28/19 0655  BP: 110/70  Pulse: 61  Resp: 12  Temp: 98.6 F (37 C)  SpO2: 97%   Body mass index is 24.99 kg/m.  Wt Readings from Last 3 Encounters:  11/28/19 132 lb 4 oz (60 kg)  07/15/19 130 lb (59 kg)  11/26/18 126 lb 9.6 oz (57.4 kg)   Physical Exam Vitals and nursing note reviewed.  Constitutional:      General: She is not in acute distress.    Appearance: She is well-developed and normal weight.  HENT:     Head: Normocephalic and atraumatic.     Right Ear: Hearing, tympanic membrane, ear canal and external ear normal.     Left Ear: Hearing, tympanic membrane, ear canal and external ear normal.     Mouth/Throat:     Mouth: Mucous membranes are moist.     Tongue: No lesions.     Pharynx: Oropharynx is clear. Uvula midline.  Eyes:     Conjunctiva/sclera: Conjunctivae normal.     Pupils: Pupils are equal, round, and reactive to light.  Neck:     Thyroid: No thyromegaly.     Trachea: No tracheal deviation.  Cardiovascular:     Rate and Rhythm: Normal rate and regular rhythm.     Pulses:          Dorsalis pedis pulses are 2+ on the right side and 2+ on the left side.     Heart sounds: No murmur heard.   Pulmonary:     Effort: Pulmonary effort is normal. No respiratory distress.     Breath sounds: Normal breath sounds.  Abdominal:     Palpations: Abdomen is soft. There is no hepatomegaly or mass.     Tenderness: There is no abdominal tenderness.  Genitourinary:    Comments: Deferred to gyn. Musculoskeletal:     Comments: No major deformity or signs of synovitis appreciated.  Lymphadenopathy:     Cervical: No cervical adenopathy.     Upper Body:     Right upper body: No  supraclavicular adenopathy.     Left upper body: No supraclavicular adenopathy.  Skin:    General: Skin is warm.     Findings: No erythema or rash.  Neurological:     General: No focal deficit present.     Mental Status: She is alert and oriented to person, place, and time.     Cranial Nerves: No cranial nerve deficit.     Coordination: Coordination normal.     Gait: Gait normal.     Deep Tendon Reflexes:     Reflex Scores:      Bicep reflexes are 2+ on the right side and 2+ on the left side.  Patellar reflexes are 2+ on the right side and 2+ on the left side. Psychiatric:        Mood and Affect: Mood and affect normal.        Speech: Speech normal.     Comments: Well groomed, good eye contact.   ASSESSMENT AND PLAN:  Ms. Nico Rogness was here today annual physical examination.  Orders Placed This Encounter  Procedures  . Basic metabolic panel  . Hemoglobin A1c  . Lipid panel  . Hepatitis C antibody    Lab Results  Component Value Date   HGBA1C 5.1 11/28/2019   Lab Results  Component Value Date   CHOL 160 11/28/2019   HDL 67 11/28/2019   LDLCALC 81 11/28/2019   TRIG 50 11/28/2019   CHOLHDL 2.4 11/28/2019   Lab Results  Component Value Date   CREATININE 0.74 11/28/2019   BUN 10 11/28/2019   NA 138 11/28/2019   K 3.9 11/28/2019   CL 105 11/28/2019   CO2 23 11/28/2019    Routine general medical examination at a health care facility We discussed the importance of regular physical activity and healthy diet for prevention of chronic illness and/or complications. Preventive guidelines reviewed. Vaccination up-to-date Continue following with gynecologist for female routine care, 12/2019 she is due for mammogram. Next CPE in a year.  Encounter for HCV screening test for low risk patient -     Hepatitis C antibody  Screening for lipoid disorders -     Lipid panel  Screening for endocrine, metabolic and immunity disorder -     Hemoglobin A1c -      Basic metabolic panel   Return in 1 year (on 11/27/2020).   Joline Encalada G. [place], MD  [facility]. [place] office.

## 2019-11-28 ENCOUNTER — Ambulatory Visit (INDEPENDENT_AMBULATORY_CARE_PROVIDER_SITE_OTHER): Payer: BC Managed Care – PPO | Admitting: Family Medicine

## 2019-11-28 ENCOUNTER — Encounter: Payer: BC Managed Care – PPO | Admitting: Family Medicine

## 2019-11-28 ENCOUNTER — Encounter: Payer: Self-pay | Admitting: Family Medicine

## 2019-11-28 ENCOUNTER — Other Ambulatory Visit: Payer: Self-pay

## 2019-11-28 VITALS — BP 110/70 | HR 61 | Temp 98.6°F | Resp 12 | Ht 61.0 in | Wt 132.2 lb

## 2019-11-28 DIAGNOSIS — Z1329 Encounter for screening for other suspected endocrine disorder: Secondary | ICD-10-CM

## 2019-11-28 DIAGNOSIS — Z13220 Encounter for screening for lipoid disorders: Secondary | ICD-10-CM | POA: Diagnosis not present

## 2019-11-28 DIAGNOSIS — Z1159 Encounter for screening for other viral diseases: Secondary | ICD-10-CM | POA: Diagnosis not present

## 2019-11-28 DIAGNOSIS — Z0000 Encounter for general adult medical examination without abnormal findings: Secondary | ICD-10-CM

## 2019-11-28 DIAGNOSIS — Z130 Encounter for screening for diseases of the blood and blood-forming organs and certain disorders involving the immune mechanism: Secondary | ICD-10-CM

## 2019-11-28 DIAGNOSIS — Z13228 Encounter for screening for other metabolic disorders: Secondary | ICD-10-CM

## 2019-11-28 NOTE — Patient Instructions (Addendum)
A few things to remember from today's visit:   Routine general medical examination at a health care facility  Encounter for HCV screening test for low risk patient - Plan: Hepatitis C antibody  Screening for lipoid disorders - Plan: Lipid panel  Screening for endocrine, metabolic and immunity disorder - Plan: Basic metabolic panel, Hemoglobin A1c   Please be sure medication list is accurate. If a new problem present, please set up appointment sooner than planned today.  Dr Talbert Nan and Dr Nelda Marseille gynecologist options.  Health Maintenance, Female Adopting a healthy lifestyle and getting preventive care are important in promoting health and wellness. Ask your health care provider about:  The right schedule for you to have regular tests and exams.  Things you can do on your own to prevent diseases and keep yourself healthy. What should I know about diet, weight, and exercise? Eat a healthy diet   Eat a diet that includes plenty of vegetables, fruits, low-fat dairy products, and lean protein.  Do not eat a lot of foods that are high in solid fats, added sugars, or sodium. Maintain a healthy weight Body mass index (BMI) is used to identify weight problems. It estimates body fat based on height and weight. Your health care provider can help determine your BMI and help you achieve or maintain a healthy weight. Get regular exercise Get regular exercise. This is one of the most important things you can do for your health. Most adults should:  Exercise for at least 150 minutes each week. The exercise should increase your heart rate and make you sweat (moderate-intensity exercise).  Do strengthening exercises at least twice a week. This is in addition to the moderate-intensity exercise.  Spend less time sitting. Even light physical activity can be beneficial. Watch cholesterol and blood lipids Have your blood tested for lipids and cholesterol at 40 years of age, then have this test every  5 years. Have your cholesterol levels checked more often if:  Your lipid or cholesterol levels are high.  You are older than 40 years of age.  You are at high risk for heart disease. What should I know about cancer screening? Depending on your health history and family history, you may need to have cancer screening at various ages. This may include screening for:  Breast cancer.  Cervical cancer.  Colorectal cancer.  Skin cancer.  Lung cancer. What should I know about heart disease, diabetes, and high blood pressure? Blood pressure and heart disease  High blood pressure causes heart disease and increases the risk of stroke. This is more likely to develop in people who have high blood pressure readings, are of African descent, or are overweight.  Have your blood pressure checked: ? Every 3-5 years if you are 28-62 years of age. ? Every year if you are 62 years old or older. Diabetes Have regular diabetes screenings. This checks your fasting blood sugar level. Have the screening done:  Once every three years after age 58 if you are at a normal weight and have a low risk for diabetes.  More often and at a younger age if you are overweight or have a high risk for diabetes. What should I know about preventing infection? Hepatitis B If you have a higher risk for hepatitis B, you should be screened for this virus. Talk with your health care provider to find out if you are at risk for hepatitis B infection. Hepatitis C Testing is recommended for:  Everyone born from [birth year] through 1966.  Anyone with known risk factors for hepatitis C. Sexually transmitted infections (STIs)  Get screened for STIs, including gonorrhea and chlamydia, if: ? You are sexually active and are younger than 40 years of age. ? You are older than 40 years of age and your health care provider tells you that you are at risk for this type of infection. ? Your sexual activity has changed since you were last  screened, and you are at increased risk for chlamydia or gonorrhea. Ask your health care provider if you are at risk.  Ask your health care provider about whether you are at high risk for HIV. Your health care provider may recommend a prescription medicine to help prevent HIV infection. If you choose to take medicine to prevent HIV, you should first get tested for HIV. You should then be tested every 3 months for as long as you are taking the medicine. Pregnancy  If you are about to stop having your period (premenopausal) and you may become pregnant, seek counseling before you get pregnant.  Take 400 to 800 micrograms (mcg) of folic acid every day if you become pregnant.  Ask for birth control (contraception) if you want to prevent pregnancy. Osteoporosis and menopause Osteoporosis is a disease in which the bones lose minerals and strength with aging. This can result in bone fractures. If you are 30 years old or older, or if you are at risk for osteoporosis and fractures, ask your health care provider if you should:  Be screened for bone loss.  Take a calcium or vitamin D supplement to lower your risk of fractures.  Be given hormone replacement therapy (HRT) to treat symptoms of menopause. Follow these instructions at home: Lifestyle  Do not use any products that contain nicotine or tobacco, such as cigarettes, e-cigarettes, and chewing tobacco. If you need help quitting, ask your health care provider.  Do not use street drugs.  Do not share needles.  Ask your health care provider for help if you need support or information about quitting drugs. Alcohol use  Do not drink alcohol if: ? Your health care provider tells you not to drink. ? You are pregnant, may be pregnant, or are planning to become pregnant.  If you drink alcohol: ? Limit how much you use to 0-1 drink a day. ? Limit intake if you are breastfeeding.  Be aware of how much alcohol is in your drink. In the [place] one  drink equals one 12 oz bottle of beer (355 mL), one 5 oz glass of wine (148 mL), or one 1 oz glass of hard liquor (44 mL). General instructions  Schedule regular health, dental, and eye exams.  Stay current with your vaccines.  Tell your health care provider if: ? You often feel depressed. ? You have ever been abused or do not feel safe at home. Summary  Adopting a healthy lifestyle and getting preventive care are important in promoting health and wellness.  Follow your health care provider's instructions about healthy diet, exercising, and getting tested or screened for diseases.  Follow your health care provider's instructions on monitoring your cholesterol and blood pressure. This information is not intended to replace advice given to you by your health care provider. Make sure you discuss any questions you have with your health care provider. Document Revised: 04/07/2018 Document Reviewed: 04/07/2018 Elsevier Patient Education  2020 [employer].

## 2019-11-29 LAB — BASIC METABOLIC PANEL
BUN: 10 mg/dL (ref 7–25)
CO2: 23 mmol/L (ref 20–32)
Calcium: 9 mg/dL (ref 8.6–10.2)
Chloride: 105 mmol/L (ref 98–110)
Creat: 0.74 mg/dL (ref 0.50–1.10)
Glucose, Bld: 85 mg/dL (ref 65–99)
Potassium: 3.9 mmol/L (ref 3.5–5.3)
Sodium: 138 mmol/L (ref 135–146)

## 2019-11-29 LAB — HEMOGLOBIN A1C
Hgb A1c MFr Bld: 5.1 % of total Hgb (ref <5.7)
Mean Plasma Glucose: 100 (calc)
eAG (mmol/L): 5.5 (calc)

## 2019-11-29 LAB — HEPATITIS C ANTIBODY
Hepatitis C Ab: NONREACTIVE
SIGNAL TO CUT-OFF: 0.01 (ref <1.00)

## 2019-11-29 LAB — LIPID PANEL
Cholesterol: 160 mg/dL (ref <200)
HDL: 67 mg/dL (ref >=50)
LDL Cholesterol (Calc): 81 mg/dL (calc)
Non-HDL Cholesterol (Calc): 93 mg/dL (calc) (ref <130)
Total CHOL/HDL Ratio: 2.4 (calc) (ref <5.0)
Triglycerides: 50 mg/dL (ref <150)

## 2019-12-13 ENCOUNTER — Encounter: Payer: Self-pay | Admitting: Family Medicine

## 2020-07-31 ENCOUNTER — Other Ambulatory Visit: Payer: Self-pay | Admitting: Family Medicine

## 2020-07-31 DIAGNOSIS — Z1231 Encounter for screening mammogram for malignant neoplasm of breast: Secondary | ICD-10-CM

## 2020-08-09 ENCOUNTER — Ambulatory Visit: Payer: Self-pay | Attending: Internal Medicine

## 2020-08-09 DIAGNOSIS — Z20822 Contact with and (suspected) exposure to covid-19: Secondary | ICD-10-CM

## 2020-08-11 LAB — NOVEL CORONAVIRUS, NAA: SARS-CoV-2, NAA: NOT DETECTED

## 2020-08-11 LAB — SARS-COV-2, NAA 2 DAY TAT

## 2020-10-09 ENCOUNTER — Other Ambulatory Visit: Payer: Self-pay

## 2020-10-09 ENCOUNTER — Other Ambulatory Visit: Payer: Self-pay | Admitting: Family Medicine

## 2020-10-09 ENCOUNTER — Ambulatory Visit
Admission: RE | Admit: 2020-10-09 | Discharge: 2020-10-09 | Disposition: A | Discharge status: Home / Self Care | Payer: BC Managed Care – PPO | Source: Ambulatory Visit

## 2020-10-09 DIAGNOSIS — Z1231 Encounter for screening mammogram for malignant neoplasm of breast: Secondary | ICD-10-CM

## 2020-10-09 DIAGNOSIS — N6310 Unspecified lump in the right breast, unspecified quadrant: Secondary | ICD-10-CM

## 2020-10-23 ENCOUNTER — Other Ambulatory Visit: Payer: BC Managed Care – PPO

## 2020-11-14 ENCOUNTER — Other Ambulatory Visit: Payer: Self-pay

## 2020-11-14 ENCOUNTER — Other Ambulatory Visit: Payer: Self-pay | Admitting: Family Medicine

## 2020-11-14 ENCOUNTER — Ambulatory Visit
Admission: RE | Admit: 2020-11-14 | Discharge: 2020-11-14 | Disposition: A | Discharge status: Home / Self Care | Payer: BC Managed Care – PPO | Source: Ambulatory Visit | Attending: Family Medicine | Admitting: Family Medicine

## 2020-11-14 DIAGNOSIS — N6310 Unspecified lump in the right breast, unspecified quadrant: Secondary | ICD-10-CM

## 2020-11-27 NOTE — Progress Notes (Signed)
HPI: [name] is a 40 y.o. female, who is here today for her routine physical.  Last CPE: 11/28/19  Regular exercise 3 or more time per week: She is doing zumba,yoga at least 3 times per week. Following a healthy diet: Yes, she does in general.Plenty of vegetables,fruit,and chicken. She lives with her husband and their 2 children.  Chronic medical problems: Otherwise healthy. No hx of HTN,DM,or HLD.  Pap smear: 11/16/19. She is established with gyn.  Immunization History  Administered Date(s) Administered   Influenza-Unspecified 01/16/2017, 03/02/2018   PFIZER(Purple Top)SARS-COV-2 Vaccination 06/23/2019, 07/19/2019   Tdap 11/28/2020  COVID 19 booster in 03/2020.  Mammogram: 11/14/20 Bi-Rads 3. It was recommended to have Dx mammogram in 11/2020. Colonoscopy: N/A DEXA: N/A Hep C screening: 11/28/19 NR  Lab Results  Component Value Date   CHOL 160 11/28/2019   HDL 67 11/28/2019   LDLCALC 81 11/28/2019   TRIG 50 11/28/2019   CHOLHDL 2.4 11/28/2019   Lab Results  Component Value Date   HGBA1C 5.1 11/28/2019   Last night she had LLE cramp like pain when in bed, similar to those she had during pregnancy. No edema or erythema. She was seated all day in a conference, so wonders if this caused the problem.  Review of Systems  Constitutional:  Negative for appetite change, fatigue and fever.  HENT:  Negative for dental problem, hearing loss, mouth sores, sore throat, trouble swallowing and voice change.   Eyes:  Negative for redness and visual disturbance.  Respiratory:  Negative for cough, shortness of breath and wheezing.   Cardiovascular:  Negative for chest pain and leg swelling.  Gastrointestinal:  Negative for abdominal pain, nausea and vomiting.       No changes in bowel habits.  Endocrine: Negative for cold intolerance, heat intolerance, polydipsia, polyphagia and polyuria.  Genitourinary:  Negative for decreased urine volume, dysuria, hematuria,  vaginal bleeding and vaginal discharge.  Musculoskeletal:  Negative for back pain and gait problem.  Skin:  Negative for color change and rash.  Allergic/Immunologic: Negative for environmental allergies.  Neurological:  Negative for syncope, weakness and headaches.  Hematological:  Negative for adenopathy. Does not bruise/bleed easily.  Psychiatric/Behavioral:  Negative for confusion and sleep disturbance. The patient is not nervous/anxious.   All other systems reviewed and are negative.  Current Outpatient Medications on File Prior to Visit  Medication Sig Dispense Refill   etonogestrel (NEXPLANON) 68 MG IMPL implant 1 each by Subdermal route once. Inserted 2020     Multiple Vitamins-Minerals (MULTIVITAMIN ADULT PO) Take by mouth daily.     No current facility-administered medications on file prior to visit.   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   No pertinent past medical history    Past Surgical History:  Procedure Laterality Date   BREAST BIOPSY Right 2019   benign   INGUINAL HERNIA REPAIR Right 07/15/2019   Procedure: OPEN RIGHT INGUINAL HERNIA REPAIR;  Surgeon: Clovis Riley, MD;  Location: [facility];  Service: General;  Laterality: Right;   No Known Allergies  Family History  Problem Relation Age of Onset   Uterine cancer Mother    Other Neg Hx    Social History   Socioeconomic History   Marital status: Married    Spouse name: Not on file   Number of children: 2   Years of education: Not on file  Highest education level: Not on file  Occupational History   Not on file  Tobacco Use   Smoking status: Never   Smokeless tobacco: Never  Vaping Use   Vaping Use: Never used  Substance and Sexual Activity   Alcohol use: No   Drug use: No   Sexual activity: Yes  Other Topics Concern   Not on file  Social History Narrative   Not on file   Social Determinants of Health    Financial Resource Strain: Not on file  Food Insecurity: Not on file  Transportation Needs: Not on file  Physical Activity: Not on file  Stress: Not on file  Social Connections: Not on file   Vitals:   11/28/20 0804  BP: 110/70  Pulse: 80  Resp: 16  Temp: 98.6 F (37 C)  SpO2: 97%   Body mass index is 25.7 kg/m.  Wt Readings from Last 3 Encounters:  11/28/20 136 lb (61.7 kg)  11/28/19 132 lb 4 oz (60 kg)  07/15/19 130 lb (59 kg)   Physical Exam Vitals and nursing note reviewed.  Constitutional:      General: She is not in acute distress.    Appearance: She is well-developed.  HENT:     Head: Normocephalic and atraumatic.     Right Ear: Hearing, tympanic membrane, ear canal and external ear normal.     Left Ear: Hearing, tympanic membrane, ear canal and external ear normal.     Mouth/Throat:     Mouth: Mucous membranes are moist.     Pharynx: Oropharynx is clear. Uvula midline.  Eyes:     Extraocular Movements: Extraocular movements intact.     Conjunctiva/sclera: Conjunctivae normal.     Pupils: Pupils are equal, round, and reactive to light.  Neck:     Thyroid: No thyromegaly.     Trachea: No tracheal deviation.  Cardiovascular:     Rate and Rhythm: Normal rate and regular rhythm.     Pulses:          Dorsalis pedis pulses are 2+ on the right side and 2+ on the left side.     Heart sounds: No murmur heard. Pulmonary:     Effort: Pulmonary effort is normal. No respiratory distress.     Breath sounds: Normal breath sounds.  Chest:  Breasts:    Right: No supraclavicular adenopathy.     Left: No supraclavicular adenopathy.  Abdominal:     Palpations: Abdomen is soft. There is no hepatomegaly or mass.     Tenderness: There is no abdominal tenderness.  Genitourinary:    Comments: Deferred to gyn. Musculoskeletal:     Comments: No major deformity or signs of synovitis appreciated.  Lymphadenopathy:     Cervical: No cervical adenopathy.     Upper Body:      Right upper body: No supraclavicular adenopathy.     Left upper body: No supraclavicular adenopathy.  Skin:    General: Skin is warm.     Findings: No erythema or rash.  Neurological:     General: No focal deficit present.     Mental Status: She is alert and oriented to person, place, and time.     Cranial Nerves: No cranial nerve deficit.     Coordination: Coordination normal.     Gait: Gait normal.     Deep Tendon Reflexes:     Reflex Scores:      Bicep reflexes are 2+ on the right side and 2+ on the left side.  Patellar reflexes are 2+ on the right side and 2+ on the left side. Psychiatric:        Speech: Speech normal.     Comments: Well groomed, good eye contact.   ASSESSMENT AND PLAN:  [name] was here today annual physical examination.  Orders Placed This Encounter  Procedures   Tdap vaccine greater than or equal to 7yo IM   Hemoglobin A1c   CBC   Comprehensive metabolic panel   TSH   Lab Results  Component Value Date   TSH 2.66 11/28/2020   Lab Results  Component Value Date   WBC 5.6 11/28/2020   HGB 11.7 (L) 11/28/2020   HCT 35.2 (L) 11/28/2020   MCV 88.0 11/28/2020   PLT 279.0 11/28/2020   Lab Results  Component Value Date   CREATININE 0.79 11/28/2020   BUN 10 11/28/2020   NA 138 11/28/2020   K 4.1 11/28/2020   CL 104 11/28/2020   CO2 26 11/28/2020   Lab Results  Component Value Date   ALT 19 11/28/2020   AST 22 11/28/2020   ALKPHOS 78 11/28/2020   BILITOT 0.6 11/28/2020   Lab Results  Component Value Date   HGBA1C 5.4 11/28/2020   Routine general medical examination at a health care facility We discussed the importance of regular physical activity and healthy diet for prevention of chronic illness and/or complications. Preventive guidelines reviewed. Vaccination updated. Continue female preventive care with gyn. She will sign a release form to obtain copy of last pap smear. Next CPE in a year.  The 10-year ASCVD  risk score Mikey Bussing DC Brooke Bonito., et al., 2013) is: 0.2%   Values used to calculate the score:     Age: 51 years     Sex: Female     Is Non-Hispanic African American: No     Diabetic: No     Tobacco smoker: No     Systolic Blood Pressure: [id number] mmHg     Is BP treated: No     HDL Cholesterol: 67 mg/dL     Total Cholesterol: 160 mg/dL  Screening for endocrine, metabolic and immunity disorder -     Comprehensive metabolic panel -     Hemoglobin A1c  Leg cramping Hx and examination today do not suggest a serious process. Adequate hydration may help to prevent future episodes. Monitor for new symptoms.  Need for Tdap vaccination -     Tdap vaccine greater than or equal to 7yo IM  Return in 1 year (on 11/28/2021) for CPE.  Russell Quinney G. Martin[place], MD  [facility]. [place] office.

## 2020-11-28 ENCOUNTER — Encounter: Payer: Self-pay | Admitting: Family Medicine

## 2020-11-28 ENCOUNTER — Other Ambulatory Visit: Payer: Self-pay

## 2020-11-28 ENCOUNTER — Ambulatory Visit (INDEPENDENT_AMBULATORY_CARE_PROVIDER_SITE_OTHER): Payer: BC Managed Care – PPO | Admitting: Family Medicine

## 2020-11-28 ENCOUNTER — Encounter: Payer: BC Managed Care – PPO | Admitting: Family Medicine

## 2020-11-28 VITALS — BP 110/70 | HR 80 | Temp 98.6°F | Resp 16 | Ht 61.0 in | Wt 136.0 lb

## 2020-11-28 DIAGNOSIS — Z23 Encounter for immunization: Secondary | ICD-10-CM | POA: Diagnosis not present

## 2020-11-28 DIAGNOSIS — Z1329 Encounter for screening for other suspected endocrine disorder: Secondary | ICD-10-CM

## 2020-11-28 DIAGNOSIS — Z130 Encounter for screening for diseases of the blood and blood-forming organs and certain disorders involving the immune mechanism: Secondary | ICD-10-CM | POA: Diagnosis not present

## 2020-11-28 DIAGNOSIS — Z0000 Encounter for general adult medical examination without abnormal findings: Secondary | ICD-10-CM

## 2020-11-28 DIAGNOSIS — R252 Cramp and spasm: Secondary | ICD-10-CM

## 2020-11-28 DIAGNOSIS — Z13228 Encounter for screening for other metabolic disorders: Secondary | ICD-10-CM

## 2020-11-28 LAB — CBC
HCT: 35.2 % — ABNORMAL LOW (ref 36.0–46.0)
Hemoglobin: 11.7 g/dL — ABNORMAL LOW (ref 12.0–15.0)
MCHC: 33.1 g/dL (ref 30.0–36.0)
MCV: 88 fl (ref 78.0–100.0)
Platelets: 279 10*3/uL (ref 150.0–400.0)
RBC: 4.01 Mil/uL (ref 3.87–5.11)
RDW: 14.4 % (ref 11.5–15.5)
WBC: 5.6 10*3/uL (ref 4.0–10.5)

## 2020-11-28 LAB — TSH: TSH: 2.66 u[IU]/mL (ref 0.35–5.50)

## 2020-11-28 LAB — COMPREHENSIVE METABOLIC PANEL
ALT: 19 U/L (ref 0–35)
AST: 22 U/L (ref 0–37)
Albumin: 4.2 g/dL (ref 3.5–5.2)
Alkaline Phosphatase: 78 U/L (ref 39–117)
BUN: 10 mg/dL (ref 6–23)
CO2: 26 mEq/L (ref 19–32)
Calcium: 9 mg/dL (ref 8.4–10.5)
Chloride: 104 mEq/L (ref 96–112)
Creatinine, Ser: 0.79 mg/dL (ref 0.40–1.20)
GFR: 93.24 mL/min (ref >60.00)
Glucose, Bld: 65 mg/dL — ABNORMAL LOW (ref 70–99)
Potassium: 4.1 mEq/L (ref 3.5–5.1)
Sodium: 138 mEq/L (ref 135–145)
Total Bilirubin: 0.6 mg/dL (ref 0.2–1.2)
Total Protein: 7.3 g/dL (ref 6.0–8.3)

## 2020-11-28 LAB — HEMOGLOBIN A1C: Hgb A1c MFr Bld: 5.4 % (ref 4.6–6.5)

## 2020-11-28 NOTE — Patient Instructions (Addendum)
Today you have you routine preventive visit. A few things to remember from today's visit:  Routine general medical examination at a health care facility  Screening for endocrine, metabolic and immunity disorder - Plan: Basic metabolic panel, Hemoglobin A1c  Do not use My Chart to request refills or for acute issues that need immediate attention.   Please be sure medication list is accurate. If a new problem present, please set up appointment sooner than planned today.  Release form to obtain copy of pap smear.  At least 150 minutes of moderate exercise per week, daily brisk walking for 15-30 min is a good exercise option. Healthy diet low in saturated (animal) fats and sweets and consisting of fresh fruits and vegetables, lean meats such as fish and white chicken and whole grains.  These are some of recommendations for screening depending of age and risk factors:  - Vaccines:  Tdap vaccine every 10 years.Given today.  Shingles vaccine recommended at age 63, could be given after 41 years of age but not sure about insurance coverage.   Pneumonia vaccines: Pneumovax at 49. Sometimes Pneumovax is giving earlier if history of smoking, lung disease,diabetes,kidney disease among some.  Screening for diabetes at age 64 and every 3 years.  Cervical cancer prevention:  Pap smear starts at 41 years of age and continues periodically until 41 years old in low risk women. Pap smear every 3 years between 65 and 69 years old. Pap smear every 3-5 years between women 56 and older if pap smear negative and HPV screening negative.  -Breast cancer: Mammogram: There is disagreement between experts about when to start screening in low risk asymptomatic female but recent recommendations are to start screening at 61 and not later than 41 years old , every 1-2 years and after 41 yo q 2 years. Screening is recommended until 41 years old but some women can continue screening depending of healthy  issues.  Colon cancer screening: Has been recently changed to 41 yo. Insurance may not cover until you are 41 years old. Screening is recommended until 41 years old.  Cholesterol disorder screening at age 28 and every 3 years.  Also recommended:  Dental visit- Brush and floss your teeth twice daily; visit your dentist twice a year. Eye doctor- Get an eye exam at least every 2 years. Helmet use- Always wear a helmet when riding a bicycle, motorcycle, rollerblading or skateboarding. Safe sex- If you may be exposed to sexually transmitted infections, use a condom. Seat belts- Seat belts can save your live; always wear one. Smoke/Carbon Monoxide detectors- These detectors need to be installed on the appropriate level of your home. Replace batteries at least once a year. Skin cancer- When out in the sun please cover up and use sunscreen 15 SPF or higher. Violence- If anyone is threatening or hurting you, please tell your healthcare provider.  Drink alcohol in moderation- Limit alcohol intake to one drink or less per day. Never drink and drive. Calcium supplementation 1000 to 1200 mg daily, ideally through your diet.  Vitamin D supplementation 800 units daily.

## 2020-12-01 MED ORDER — FERROUS SULFATE 325 (65 FE) MG PO TABS: 325.0000 mg | ORAL_TABLET | Freq: Every day | ORAL | 2 refills | Status: DC

## 2021-05-17 ENCOUNTER — Other Ambulatory Visit: Payer: Self-pay

## 2021-05-17 ENCOUNTER — Ambulatory Visit
Admission: RE | Admit: 2021-05-17 | Discharge: 2021-05-17 | Disposition: A | Discharge status: Home / Self Care | Payer: BC Managed Care – PPO | Source: Ambulatory Visit | Attending: Family Medicine | Admitting: Family Medicine

## 2021-05-17 ENCOUNTER — Other Ambulatory Visit: Payer: Self-pay | Admitting: Family Medicine

## 2021-05-17 DIAGNOSIS — N6310 Unspecified lump in the right breast, unspecified quadrant: Secondary | ICD-10-CM

## 2022-04-16 NOTE — Progress Notes (Signed)
HPI: Sheila Kennedy is a 41 y.o. female with not significant PMHx here today for her routine physical.  Last CPE: 11/28/20 No new problems since her last visit.  She exercises daily at home using an aerobics app and maintains a healthy diet, which includes chicken, salmon, lean meat, fruits, vegetables, rice, beans, and homemade food.  She reports sleeping an average of 6.5 to 7 hours per night. She denies excessive alcohol consumption or tobacco use.  She was her gynecologist in 07/2021 for Nexplanon change. Mammogram in 04/2021 Bi-Rads 3, a year follow up was recommended.  Immunization History  Administered Date(s) Administered   Influenza,inj,Quad PF,6+ Mos 04/18/2022   Influenza-Unspecified 01/16/2017, 03/02/2018, 03/23/2021   PFIZER(Purple Top)SARS-COV-2 Vaccination 06/23/2019, 07/19/2019   Tdap 11/28/2020   Health Maintenance  Topic Date Due   COVID-19 Vaccine (3 - 2023-24 season) 12/27/2021   PAP SMEAR-Modifier  11/16/2022   DTaP/Tdap/Td (2 - Td or Tdap) 11/29/2030   INFLUENZA VACCINE  Completed   Hepatitis C Screening  Completed   HIV Screening  Completed   HPV VACCINES  Aged Out   Anemia: Since she started with Nexplanon her menstrual periods have been lighter. She has not noted blood in stool or melena. She is on iron supplementation.  Lab Results  Component Value Date   WBC 5.6 11/28/2020   HGB 11.7 (L) 11/28/2020   HCT 35.2 (L) 11/28/2020   MCV 88.0 11/28/2020   PLT 279.0 11/28/2020   Review of Systems  Constitutional:  Negative for activity change, appetite change and fever.  HENT:  Negative for hearing loss, mouth sores, sore throat and trouble swallowing.   Eyes:  Negative for redness and visual disturbance.  Respiratory:  Negative for cough, shortness of breath and wheezing.   Cardiovascular:  Negative for chest pain, palpitations and leg swelling.  Gastrointestinal:  Negative for abdominal pain, nausea and vomiting.       No changes in bowel  habits.  Endocrine: Negative for cold intolerance, heat intolerance, polydipsia, polyphagia and polyuria.  Genitourinary:  Negative for decreased urine volume, dysuria, hematuria, vaginal bleeding and vaginal discharge.  Musculoskeletal:  Negative for gait problem and myalgias.  Skin:  Negative for color change and rash.  Allergic/Immunologic: Negative for environmental allergies.  Neurological:  Negative for seizures, syncope, weakness and headaches.  Hematological:  Negative for adenopathy. Does not bruise/bleed easily.  Psychiatric/Behavioral:  Negative for confusion. The patient is not nervous/anxious.   All other systems reviewed and are negative.  Current Outpatient Medications on File Prior to Visit  Medication Sig Dispense Refill   etonogestrel (NEXPLANON) 68 MG IMPL implant 1 each by Subdermal route once. Inserted 2020     ferrous sulfate 325 (65 FE) MG tablet Take 1 tablet (325 mg total) by mouth daily. 90 tablet 2   Multiple Vitamins-Minerals (MULTIVITAMIN ADULT PO) Take by mouth daily.     No current facility-administered medications on file prior to visit.   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   No pertinent past medical history    Past Surgical History:  Procedure Laterality Date   BREAST BIOPSY Right 2019   benign   INGUINAL HERNIA REPAIR Right 07/15/2019   Procedure: OPEN RIGHT INGUINAL HERNIA REPAIR;  Surgeon: Clovis Riley, MD;  Location: [place];  Service: General;  Laterality: Right;   No Known Allergies  Family History  Problem Relation  Age of Onset   Uterine cancer Mother    Other Neg Hx    Social History   Socioeconomic History   Marital status: Married    Spouse name: Not on file   Number of children: 2   Years of education: Not on file   Highest education level: Not on file  Occupational History   Not on file  Tobacco Use   Smoking status: Never    Smokeless tobacco: Never  Vaping Use   Vaping Use: Never used  Substance and Sexual Activity   Alcohol use: No   Drug use: No   Sexual activity: Yes  Other Topics Concern   Not on file  Social History Narrative   Not on file   Social Determinants of Health   Financial Resource Strain: Not on file  Food Insecurity: Not on file  Transportation Needs: Not on file  Physical Activity: Not on file  Stress: Not on file  Social Connections: Not on file   Today's Vitals   04/18/22 0946  BP: 100/70  Pulse: 79  Resp: 12  Temp: 98.6 F (37 C)  TempSrc: Oral  SpO2: 99%  Weight: 132 lb 8 oz (60.1 kg)  Height: '5\' 1"'$  (1.549 m)   Wt Readings from Last 3 Encounters:  04/18/22 132 lb 8 oz (60.1 kg)  11/28/20 136 lb (61.7 kg)  11/28/19 132 lb 4 oz (60 kg)   Body mass index is 25.04 kg/m.  Physical Exam Vitals and nursing note reviewed.  Constitutional:      General: She is not in acute distress.    Appearance: She is well-developed.  HENT:     Head: Normocephalic and atraumatic.     Right Ear: Hearing, tympanic membrane, ear canal and external ear normal.     Left Ear: Hearing, tympanic membrane, ear canal and external ear normal.     Mouth/Throat:     Mouth: Mucous membranes are moist.     Pharynx: Oropharynx is clear. Uvula midline.  Eyes:     Extraocular Movements: Extraocular movements intact.     Conjunctiva/sclera: Conjunctivae normal.     Pupils: Pupils are equal, round, and reactive to light.  Neck:     Thyroid: No thyromegaly.     Trachea: No tracheal deviation.  Cardiovascular:     Rate and Rhythm: Normal rate and regular rhythm.     Pulses:          Dorsalis pedis pulses are 2+ on the right side and 2+ on the left side.     Heart sounds: No murmur heard. Pulmonary:     Effort: Pulmonary effort is normal. No respiratory distress.     Breath sounds: Normal breath sounds.  Abdominal:     Palpations: Abdomen is soft. There is no hepatomegaly or mass.      Tenderness: There is no abdominal tenderness.  Genitourinary:    Comments: Deferred to gyn. Musculoskeletal:     Comments: No major deformity or signs of synovitis appreciated.  Lymphadenopathy:     Cervical: No cervical adenopathy.     Upper Body:     Right upper body: No supraclavicular adenopathy.     Left upper body: No supraclavicular adenopathy.  Skin:    General: Skin is warm.     Findings: No erythema or rash.  Neurological:     General: No focal deficit present.     Mental Status: She is alert and oriented to person, place, and time.  Cranial Nerves: No cranial nerve deficit.     Coordination: Coordination normal.     Gait: Gait normal.     Deep Tendon Reflexes:     Reflex Scores:      Bicep reflexes are 2+ on the right side and 2+ on the left side.      Patellar reflexes are 2+ on the right side and 2+ on the left side. Psychiatric:        Mood and Affect: Mood and affect normal.   ASSESSMENT AND PLAN: Ms. Tesslyn Baumert was here today annual physical examination.  Orders Placed This Encounter  Procedures   Flu Vaccine QUAD 85moIM (Fluarix, Fluzone & Alfiuria Quad PF)   CBC   Iron   Ferritin   Basic metabolic panel   Lipid panel   Hemoglobin A1c   Lab Results  Component Value Date   CREATININE 0.79 11/28/2020   BUN 10 11/28/2020   NA 138 11/28/2020   K 4.1 11/28/2020   CL 104 11/28/2020   CO2 26 11/28/2020   Lab Results  Component Value Date   HGBA1C 5.4 11/28/2020   Lab Results  Component Value Date   WBC 5.6 11/28/2020   HGB 11.7 (L) 11/28/2020   HCT 35.2 (L) 11/28/2020   MCV 88.0 11/28/2020   PLT 279.0 11/28/2020   Lab Results  Component Value Date   CHOL 160 11/28/2019   HDL 67 11/28/2019   LDLCALC 81 11/28/2019   TRIG 50 11/28/2019   CHOLHDL 2.4 11/28/2019   Routine general medical examination at a health care facility Assessment & Plan: We discussed the importance of regular physical activity and healthy diet for  prevention of chronic illness and/or complications. Preventive guidelines reviewed. Vaccination up to date, flu vaccine given today. Continue her female preventive care with her gyn. Next CPE in a year.   Screening for endocrine, metabolic and immunity disorder -     Basic metabolic panel; Future -     Hemoglobin A1c; Future  Screening for lipoid disorders -     Lipid panel; Future  Iron deficiency anemia, unspecified iron deficiency anemia type Assessment & Plan: Menstrual periods are not longer heavy since she has been with Nexplanon. We discussed possible causes. Guaiac cards x 3 given and instructed how to collect stool sample. She will bring cards when completed. Further recommendations according to lab results.  Orders: -     CBC; Future -     Iron -     Ferritin  Need for influenza vaccination -     Flu Vaccine QUAD 616moM (Fluarix, Fluzone & Alfiuria Quad PF)  Return in 1 year (on 04/19/2023) for CPE.  Timoth Schara G. JoMartiniqueMD  [facility] CenterBrNashvilleffice.

## 2022-04-18 ENCOUNTER — Ambulatory Visit (INDEPENDENT_AMBULATORY_CARE_PROVIDER_SITE_OTHER): Payer: BC Managed Care – PPO | Admitting: Family Medicine

## 2022-04-18 ENCOUNTER — Encounter: Payer: Self-pay | Admitting: Family Medicine

## 2022-04-18 VITALS — BP 100/70 | HR 79 | Temp 98.6°F | Resp 12 | Ht 61.0 in | Wt 132.5 lb

## 2022-04-18 DIAGNOSIS — D509 Iron deficiency anemia, unspecified: Secondary | ICD-10-CM

## 2022-04-18 DIAGNOSIS — Z130 Encounter for screening for diseases of the blood and blood-forming organs and certain disorders involving the immune mechanism: Secondary | ICD-10-CM | POA: Diagnosis not present

## 2022-04-18 DIAGNOSIS — Z1329 Encounter for screening for other suspected endocrine disorder: Secondary | ICD-10-CM | POA: Diagnosis not present

## 2022-04-18 DIAGNOSIS — Z13220 Encounter for screening for lipoid disorders: Secondary | ICD-10-CM

## 2022-04-18 DIAGNOSIS — Z13228 Encounter for screening for other metabolic disorders: Secondary | ICD-10-CM | POA: Diagnosis not present

## 2022-04-18 DIAGNOSIS — Z23 Encounter for immunization: Secondary | ICD-10-CM

## 2022-04-18 DIAGNOSIS — Z0000 Encounter for general adult medical examination without abnormal findings: Secondary | ICD-10-CM | POA: Insufficient documentation

## 2022-04-18 LAB — LIPID PANEL
Cholesterol: 195 mg/dL (ref 0–200)
HDL: 82.3 mg/dL (ref >39.00)
LDL Cholesterol: 105 mg/dL — ABNORMAL HIGH (ref 0–99)
NonHDL: 113.18
Total CHOL/HDL Ratio: 2
Triglycerides: 42 mg/dL (ref 0.0–149.0)
VLDL: 8.4 mg/dL (ref 0.0–40.0)

## 2022-04-18 LAB — BASIC METABOLIC PANEL
BUN: 10 mg/dL (ref 6–23)
CO2: 27 mEq/L (ref 19–32)
Calcium: 9.4 mg/dL (ref 8.4–10.5)
Chloride: 105 mEq/L (ref 96–112)
Creatinine, Ser: 0.72 mg/dL (ref 0.40–1.20)
GFR: 103.21 mL/min (ref >60.00)
Glucose, Bld: 88 mg/dL (ref 70–99)
Potassium: 3.8 mEq/L (ref 3.5–5.1)
Sodium: 139 mEq/L (ref 135–145)

## 2022-04-18 LAB — CBC
HCT: 39.4 % (ref 36.0–46.0)
Hemoglobin: 13.1 g/dL (ref 12.0–15.0)
MCHC: 33.4 g/dL (ref 30.0–36.0)
MCV: 90.4 fl (ref 78.0–100.0)
Platelets: 308 10*3/uL (ref 150.0–400.0)
RBC: 4.35 Mil/uL (ref 3.87–5.11)
RDW: 13.6 % (ref 11.5–15.5)
WBC: 5.8 10*3/uL (ref 4.0–10.5)

## 2022-04-18 LAB — IRON: Iron: 100 ug/dL (ref 42–145)

## 2022-04-18 LAB — HEMOGLOBIN A1C: Hgb A1c MFr Bld: 5.5 % (ref 4.6–6.5)

## 2022-04-18 NOTE — Assessment & Plan Note (Signed)
Menstrual periods are not longer heavy since she has been with Nexplanon. We discussed possible causes. Guaiac cards x 3 given and instructed how to collect stool sample. She will bring cards when completed. Further recommendations according to lab results.

## 2022-04-18 NOTE — Assessment & Plan Note (Signed)
We discussed the importance of regular physical activity and healthy diet for prevention of chronic illness and/or complications. Preventive guidelines reviewed. Vaccination up to date, flu vaccine given today. Continue her female preventive care with her gyn. Next CPE in a year.

## 2022-04-18 NOTE — Patient Instructions (Addendum)
A few things to remember from today's visit:  Routine general medical examination at a health care facility  Screening for endocrine, metabolic and immunity disorder - Plan: Basic metabolic panel, Hemoglobin A1c  Screening for lipoid disorders - Plan: Lipid panel  Iron deficiency anemia, unspecified iron deficiency anemia type - Plan: CBC, Iron, Ferritin  Do not use My Chart to request refills or for acute issues that need immediate attention. If you send a my chart message, it may take a few days to be addressed, specially if I am not in the office.  Please be sure medication list is accurate. If a new problem present, please set up appointment sooner than planned today. Health Maintenance, Female Adopting a healthy lifestyle and getting preventive care are important in promoting health and wellness. Ask your health care provider about: The right schedule for you to have regular tests and exams. Things you can do on your own to prevent diseases and keep yourself healthy. What should I know about diet, weight, and exercise? Eat a healthy diet  Eat a diet that includes plenty of vegetables, fruits, low-fat dairy products, and lean protein. Do not eat a lot of foods that are high in solid fats, added sugars, or sodium. Maintain a healthy weight Body mass index (BMI) is used to identify weight problems. It estimates body fat based on height and weight. Your health care provider can help determine your BMI and help you achieve or maintain a healthy weight. Get regular exercise Get regular exercise. This is one of the most important things you can do for your health. Most adults should: Exercise for at least 150 minutes each week. The exercise should increase your heart rate and make you sweat (moderate-intensity exercise). Do strengthening exercises at least twice a week. This is in addition to the moderate-intensity exercise. Spend less time sitting. Even light physical activity can be  beneficial. Watch cholesterol and blood lipids Have your blood tested for lipids and cholesterol at 42 years of age, then have this test every 5 years. Have your cholesterol levels checked more often if: Your lipid or cholesterol levels are high. You are older than 42 years of age. You are at high risk for heart disease. What should I know about cancer screening? Depending on your health history and family history, you may need to have cancer screening at various ages. This may include screening for: Breast cancer. Cervical cancer. Colorectal cancer. Skin cancer. Lung cancer. What should I know about heart disease, diabetes, and high blood pressure? Blood pressure and heart disease High blood pressure causes heart disease and increases the risk of stroke. This is more likely to develop in people who have high blood pressure readings or are overweight. Have your blood pressure checked: Every 3-5 years if you are 15-40 years of age. Every year if you are 47 years old or older. Diabetes Have regular diabetes screenings. This checks your fasting blood sugar level. Have the screening done: Once every three years after age 74 if you are at a normal weight and have a low risk for diabetes. More often and at a younger age if you are overweight or have a high risk for diabetes. What should I know about preventing infection? Hepatitis B If you have a higher risk for hepatitis B, you should be screened for this virus. Talk with your health care provider to find out if you are at risk for hepatitis B infection. Hepatitis C Testing is recommended for: Everyone born from  1946 through 55. Anyone with known risk factors for hepatitis C. Sexually transmitted infections (STIs) Get screened for STIs, including gonorrhea and chlamydia, if: You are sexually active and are younger than 42 years of age. You are older than 42 years of age and your health care provider tells you that you are at risk for  this type of infection. Your sexual activity has changed since you were last screened, and you are at increased risk for chlamydia or gonorrhea. Ask your health care provider if you are at risk. Ask your health care provider about whether you are at high risk for HIV. Your health care provider may recommend a prescription medicine to help prevent HIV infection. If you choose to take medicine to prevent HIV, you should first get tested for HIV. You should then be tested every 3 months for as long as you are taking the medicine. Pregnancy If you are about to stop having your period (premenopausal) and you may become pregnant, seek counseling before you get pregnant. Take 400 to 800 micrograms (mcg) of folic acid every day if you become pregnant. Ask for birth control (contraception) if you want to prevent pregnancy. Osteoporosis and menopause Osteoporosis is a disease in which the bones lose minerals and strength with aging. This can result in bone fractures. If you are 71 years old or older, or if you are at risk for osteoporosis and fractures, ask your health care provider if you should: Be screened for bone loss. Take a calcium or vitamin D supplement to lower your risk of fractures. Be given hormone replacement therapy (HRT) to treat symptoms of menopause. Follow these instructions at home: Alcohol use Do not drink alcohol if: Your health care provider tells you not to drink. You are pregnant, may be pregnant, or are planning to become pregnant. If you drink alcohol: Limit how much you have to: 0-1 drink a day. Know how much alcohol is in your drink. In the [place] one drink equals one 12 oz bottle of beer (355 mL), one 5 oz glass of wine (148 mL), or one 1 oz glass of hard liquor (44 mL). Lifestyle Do not use any products that contain nicotine or tobacco. These products include cigarettes, chewing tobacco, and vaping devices, such as e-cigarettes. If you need help quitting, ask your health  care provider. Do not use street drugs. Do not share needles. Ask your health care provider for help if you need support or information about quitting drugs. General instructions Schedule regular health, dental, and eye exams. Stay current with your vaccines. Tell your health care provider if: You often feel depressed. You have ever been abused or do not feel safe at home. Summary Adopting a healthy lifestyle and getting preventive care are important in promoting health and wellness. Follow your health care provider's instructions about healthy diet, exercising, and getting tested or screened for diseases. Follow your health care provider's instructions on monitoring your cholesterol and blood pressure. This information is not intended to replace advice given to you by your health care provider. Make sure you discuss any questions you have with your health care provider. Document Revised: 09/03/2020 Document Reviewed: 09/03/2020 Elsevier Patient Education  [employer].

## 2022-04-19 LAB — FERRITIN: Ferritin: 30.4 ng/mL (ref 10.0–291.0)

## 2022-04-22 ENCOUNTER — Encounter: Payer: Self-pay | Admitting: Family Medicine

## 2022-04-23 ENCOUNTER — Other Ambulatory Visit: Payer: Self-pay

## 2022-04-23 ENCOUNTER — Other Ambulatory Visit (INDEPENDENT_AMBULATORY_CARE_PROVIDER_SITE_OTHER): Payer: BC Managed Care – PPO

## 2022-04-23 ENCOUNTER — Other Ambulatory Visit: Payer: Self-pay | Admitting: Family Medicine

## 2022-04-23 DIAGNOSIS — D509 Iron deficiency anemia, unspecified: Secondary | ICD-10-CM | POA: Diagnosis not present

## 2022-04-23 DIAGNOSIS — R921 Mammographic calcification found on diagnostic imaging of breast: Secondary | ICD-10-CM

## 2022-04-23 LAB — HEMOCCULT SLIDES (X 3 CARDS)
Fecal Occult Blood: NEGATIVE
OCCULT 1: NEGATIVE
OCCULT 2: NEGATIVE
OCCULT 3: NEGATIVE
OCCULT 4: NEGATIVE
OCCULT 5: NEGATIVE

## 2022-04-23 NOTE — Addendum Note (Signed)
Addended by: Rodrigo Ran on: 04/23/2022 03:20 PM   Modules accepted: Orders

## 2022-05-24 ENCOUNTER — Ambulatory Visit
Admission: RE | Admit: 2022-05-24 | Discharge: 2022-05-24 | Disposition: A | Discharge status: Home / Self Care | Payer: BC Managed Care – PPO | Source: Ambulatory Visit | Attending: Family Medicine | Admitting: Family Medicine

## 2022-05-24 DIAGNOSIS — R921 Mammographic calcification found on diagnostic imaging of breast: Secondary | ICD-10-CM

## 2023-04-20 NOTE — Progress Notes (Signed)
HPI: Sheila Kennedy is a 42 y.o. female with a PMHx significant for iron deficiency anemia and HLD, who is here today for her routine physical.  Last CPE: 04/18/2022  Exercise: Patient states she is doing pilates and yoga for 10-15 minutes per day on average, but admits she misses some days.  Diet: She cooks at home. She is not eating vegetables daily.  Sleep: 7-8 hours per night.  Alcohol Use: She rarely drinks. Smoking: never Vision: She is not established with an eye care provider.   Dental: UTD on routine dental care. She sees a provider in [place].  She follows regularly with gynecology and last saw them in 07/2022.   Immunization History  Administered Date(s) Administered   Influenza,inj,Quad PF,6+ Mos 04/18/2022   Influenza-Unspecified 01/16/2017, 03/02/2018, 03/23/2021   PFIZER(Purple Top)SARS-COV-2 Vaccination 06/23/2019, 07/19/2019   Tdap 11/28/2020   Health Maintenance  Topic Date Due   Cervical Cancer Screening (HPV/Pap Cotest)  Never done   INFLUENZA VACCINE  11/27/2022   COVID-19 Vaccine (3 - 2024-25 season) 12/28/2022   DTaP/Tdap/Td (2 - Td or Tdap) 11/29/2030   Hepatitis C Screening  Completed   HIV Screening  Completed   HPV VACCINES  Aged Out   Chronic medical problems:   She has a history of mild hyperlipidemia, and is concerned about her FMHx of HLD.  Lab Results  Component Value Date   CHOL 195 04/18/2022   HDL 82.30 04/18/2022   LDLCALC 105 (H) 04/18/2022   TRIG 42.0 04/18/2022   CHOLHDL 2 04/18/2022   Iron deficiency anemia:  She has a history of DUB.  She is not currently taking iron supplementation.  Follows with gynecologist.  Review of Systems  Constitutional:  Negative for activity change, appetite change and fever.  HENT:  Negative for hearing loss, mouth sores, sore throat and trouble swallowing.   Eyes:  Negative for redness and visual disturbance.  Respiratory:  Negative for cough, shortness of breath and wheezing.    Cardiovascular:  Negative for chest pain and leg swelling.  Gastrointestinal:  Negative for abdominal pain, nausea and vomiting.       No changes in bowel habits.  Endocrine: Negative for cold intolerance, heat intolerance, polydipsia, polyphagia and polyuria.  Genitourinary:  Positive for menstrual problem. Negative for decreased urine volume, dysuria, hematuria, vaginal bleeding and vaginal discharge.  Musculoskeletal:  Negative for gait problem and myalgias.  Skin:  Negative for color change and rash.  Allergic/Immunologic: Negative for environmental allergies.  Neurological:  Negative for seizures, syncope, weakness and headaches.  Hematological:  Negative for adenopathy. Does not bruise/bleed easily.  Psychiatric/Behavioral:  Negative for confusion. Decreased concentration: .vital.The patient is not nervous/anxious.   All other systems reviewed and are negative.  Current Outpatient Medications on File Prior to Visit  Medication Sig Dispense Refill   etonogestrel (NEXPLANON) 68 MG IMPL implant 1 each by Subdermal route once. Inserted 2020     ferrous sulfate 325 (65 FE) MG tablet Take 1 tablet (325 mg total) by mouth daily. 90 tablet 2   Multiple Vitamins-Minerals (MULTIVITAMIN ADULT PO) Take by mouth daily.     No current facility-administered medications on file prior to visit.   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   No pertinent past medical history     Past Surgical History:  Procedure Laterality Date   BREAST BIOPSY Right 2019   benign  INGUINAL HERNIA REPAIR Right 07/15/2019   Procedure: OPEN RIGHT INGUINAL HERNIA REPAIR;  Surgeon: Berna Bue, MD;  Location: [facility];  Service: General;  Laterality: Right;   No Known Allergies  Family History  Problem Relation Age of Onset   Uterine cancer Mother    Other Neg Hx     Social History   Socioeconomic History   Marital  status: Married    Spouse name: Not on file   Number of children: 2   Years of education: Not on file   Highest education level: Not on file  Occupational History   Not on file  Tobacco Use   Smoking status: Never   Smokeless tobacco: Never  Vaping Use   Vaping status: Never Used  Substance and Sexual Activity   Alcohol use: No   Drug use: No   Sexual activity: Yes  Other Topics Concern   Not on file  Social History Narrative   Not on file   Social Drivers of Health   Financial Resource Strain: Not on file  Food Insecurity: Not on file  Transportation Needs: Not on file  Physical Activity: Not on file  Stress: Not on file  Social Connections: Unknown (09/21/2022)   Received from [facility]   Social Network    Social Network: Not on file   Today's Vitals   04/21/23 0801  BP: 118/70  Pulse: 82  Resp: 12  Temp: 98.1 F (36.7 C)  TempSrc: Oral  SpO2: 99%  Weight: 133 lb 4 oz (60.4 kg)  Height: 5\' 1"  (1.549 m)   Body mass index is 25.18 kg/m. Wt Readings from Last 3 Encounters:  04/18/22 132 lb 8 oz (60.1 kg)  11/28/20 136 lb (61.7 kg)  11/28/19 132 lb 4 oz (60 kg)   Physical Exam Vitals and nursing note reviewed.  Constitutional:      General: She is not in acute distress.    Appearance: She is well-developed.  HENT:     Head: Normocephalic and atraumatic.     Right Ear: Hearing, tympanic membrane, ear canal and external ear normal.     Left Ear: Hearing, tympanic membrane, ear canal and external ear normal.     Mouth/Throat:     Mouth: Mucous membranes are moist.     Pharynx: Oropharynx is clear. Uvula midline.  Eyes:     Extraocular Movements: Extraocular movements intact.     Conjunctiva/sclera: Conjunctivae normal.     Pupils: Pupils are equal, round, and reactive to light.  Neck:     Thyroid: No thyromegaly.     Trachea: No tracheal deviation.  Cardiovascular:     Rate and Rhythm: Normal rate and regular rhythm.     Pulses:           Dorsalis pedis pulses are 2+ on the right side and 2+ on the left side.     Heart sounds: No murmur heard. Pulmonary:     Effort: Pulmonary effort is normal. No respiratory distress.     Breath sounds: Normal breath sounds.  Abdominal:     Palpations: Abdomen is soft. There is no hepatomegaly or mass.     Tenderness: There is no abdominal tenderness.  Genitourinary:    Comments: Deferred to gyn. Musculoskeletal:     Right lower leg: No edema.     Left lower leg: No edema.     Comments: No major deformity or signs of synovitis appreciated.  Lymphadenopathy:  Cervical: No cervical adenopathy.     Upper Body:     Right upper body: No supraclavicular adenopathy.     Left upper body: No supraclavicular adenopathy.  Skin:    General: Skin is warm.     Findings: No erythema or rash.  Neurological:     General: No focal deficit present.     Mental Status: She is alert and oriented to person, place, and time.     Cranial Nerves: No cranial nerve deficit.     Coordination: Coordination normal.     Gait: Gait normal.     Deep Tendon Reflexes:     Reflex Scores:      Bicep reflexes are 2+ on the right side and 2+ on the left side.      Patellar reflexes are 2+ on the right side and 2+ on the left side. Psychiatric:        Mood and Affect: Mood and affect normal.   ASSESSMENT AND PLAN:  Sheila Kennedy was here today for her annual physical examination.  Lab Results  Component Value Date   CHOL 176 04/21/2023   HDL 77.20 04/21/2023   LDLCALC 88 04/21/2023   TRIG 52.0 04/21/2023   CHOLHDL 2 04/21/2023   Lab Results  Component Value Date   NA 140 04/21/2023   CL 105 04/21/2023   K 4.3 04/21/2023   CO2 28 04/21/2023   BUN 19 04/21/2023   CREATININE 0.65 04/21/2023   GFR 107.92 04/21/2023   CALCIUM 9.0 04/21/2023   ALBUMIN 4.2 11/28/2020   GLUCOSE 87 04/21/2023   Lab Results  Component Value Date   HGBA1C 5.5 04/21/2023   Lab Results  Component  Value Date   TSH 2.70 04/21/2023   Lab Results  Component Value Date   WBC 5.7 04/21/2023   HGB 12.8 04/21/2023   HCT 37.8 04/21/2023   MCV 91.0 04/21/2023   PLT 287.0 04/21/2023   Routine general medical examination at a health care facility Assessment & Plan: We discussed the importance of regular physical activity and healthy diet for prevention of chronic illness and/or complications. Preventive guidelines reviewed. Vaccination up to date,declined flu vaccine. Continue her female preventive care with her gyn. Next CPE in a year.   Hyperlipidemia, unspecified hyperlipidemia type Assessment & Plan: Continue non pharmacologic treatment. Further recommendations will be given according to 10 years CVD risk score and lipid panel numbers.  Orders: -     Lipid panel; Future  Screening for endocrine, metabolic and immunity disorder -     Basic metabolic panel; Future -     Hemoglobin A1c; Future  Iron deficiency anemia due to chronic blood loss Assessment & Plan: She is not on iron supplementation, hx of DUB. Hemoccult cards x 3 negative. Further recommendations according to cbc and iron results.  Orders: -     CBC; Future -     Iron; Future -     TSH; Future   Return in 1 year (on 04/20/2024) for CPE.  I, Rolla Etienne Wierda, acting as a scribe for Kailash Hinze [place], MD., have documented all relevant documentation on the behalf of Satin Boal [place], MD, as directed by  Tamma Brigandi [place], MD while in the presence of Makenzye Troutman [place], MD.   I, Suanne Marker, have reviewed all documentation for this visit. The documentation on 04/20/23 for the exam, diagnosis, procedures, and orders are all accurate and complete.  Tayen Narang G. [place], MD  [facility]. [place] office.

## 2023-04-21 ENCOUNTER — Ambulatory Visit: Payer: BC Managed Care – PPO | Admitting: Family Medicine

## 2023-04-21 ENCOUNTER — Encounter: Payer: Self-pay | Admitting: Family Medicine

## 2023-04-21 VITALS — BP 118/70 | HR 82 | Temp 98.1°F | Resp 12 | Ht 61.0 in | Wt 133.2 lb

## 2023-04-21 DIAGNOSIS — D500 Iron deficiency anemia secondary to blood loss (chronic): Secondary | ICD-10-CM | POA: Diagnosis not present

## 2023-04-21 DIAGNOSIS — Z13228 Encounter for screening for other metabolic disorders: Secondary | ICD-10-CM | POA: Diagnosis not present

## 2023-04-21 DIAGNOSIS — E785 Hyperlipidemia, unspecified: Secondary | ICD-10-CM | POA: Insufficient documentation

## 2023-04-21 DIAGNOSIS — Z1329 Encounter for screening for other suspected endocrine disorder: Secondary | ICD-10-CM

## 2023-04-21 DIAGNOSIS — Z0000 Encounter for general adult medical examination without abnormal findings: Secondary | ICD-10-CM

## 2023-04-21 DIAGNOSIS — Z130 Encounter for screening for diseases of the blood and blood-forming organs and certain disorders involving the immune mechanism: Secondary | ICD-10-CM | POA: Diagnosis not present

## 2023-04-21 LAB — CBC
HCT: 37.8 % (ref 36.0–46.0)
Hemoglobin: 12.8 g/dL (ref 12.0–15.0)
MCHC: 33.8 g/dL (ref 30.0–36.0)
MCV: 91 fL (ref 78.0–100.0)
Platelets: 287 10*3/uL (ref 150.0–400.0)
RBC: 4.15 Mil/uL (ref 3.87–5.11)
RDW: 13.7 % (ref 11.5–15.5)
WBC: 5.7 10*3/uL (ref 4.0–10.5)

## 2023-04-21 LAB — BASIC METABOLIC PANEL
BUN: 19 mg/dL (ref 6–23)
CO2: 28 meq/L (ref 19–32)
Calcium: 9 mg/dL (ref 8.4–10.5)
Chloride: 105 meq/L (ref 96–112)
Creatinine, Ser: 0.65 mg/dL (ref 0.40–1.20)
GFR: 107.92 mL/min (ref >60.00)
Glucose, Bld: 87 mg/dL (ref 70–99)
Potassium: 4.3 meq/L (ref 3.5–5.1)
Sodium: 140 meq/L (ref 135–145)

## 2023-04-21 LAB — LIPID PANEL
Cholesterol: 176 mg/dL (ref 0–200)
HDL: 77.2 mg/dL (ref >39.00)
LDL Cholesterol: 88 mg/dL (ref 0–99)
NonHDL: 98.79
Total CHOL/HDL Ratio: 2
Triglycerides: 52 mg/dL (ref 0.0–149.0)
VLDL: 10.4 mg/dL (ref 0.0–40.0)

## 2023-04-21 LAB — TSH: TSH: 2.7 u[IU]/mL (ref 0.35–5.50)

## 2023-04-21 LAB — IRON: Iron: 39 ug/dL — ABNORMAL LOW (ref 42–145)

## 2023-04-21 LAB — HEMOGLOBIN A1C: Hgb A1c MFr Bld: 5.5 % (ref 4.6–6.5)

## 2023-04-21 NOTE — Patient Instructions (Addendum)
A few things to remember from today's visit:  Routine general medical examination at a health care facility  Hyperlipidemia, unspecified hyperlipidemia type - Plan: Lipid panel  Screening for endocrine, metabolic and immunity disorder - Plan: Basic metabolic panel, Hemoglobin A1c  Iron deficiency anemia due to chronic blood loss - Plan: CBC, Iron, TSH  If you need refills for medications you take chronically, please call your pharmacy. Do not use My Chart to request refills or for acute issues that need immediate attention. If you send a my chart message, it may take a few days to be addressed, specially if I am not in the office.  Please be sure medication list is accurate. If a new problem present, please set up appointment sooner than planned today.  Health Maintenance, Female Adopting a healthy lifestyle and getting preventive care are important in promoting health and wellness. Ask your health care provider about: The right schedule for you to have regular tests and exams. Things you can do on your own to prevent diseases and keep yourself healthy. What should I know about diet, weight, and exercise? Eat a healthy diet  Eat a diet that includes plenty of vegetables, fruits, low-fat dairy products, and lean protein. Do not eat a lot of foods that are high in solid fats, added sugars, or sodium. Maintain a healthy weight Body mass index (BMI) is used to identify weight problems. It estimates body fat based on height and weight. Your health care provider can help determine your BMI and help you achieve or maintain a healthy weight. Get regular exercise Get regular exercise. This is one of the most important things you can do for your health. Most adults should: Exercise for at least 150 minutes each week. The exercise should increase your heart rate and make you sweat (moderate-intensity exercise). Do strengthening exercises at least twice a week. This is in addition to the  moderate-intensity exercise. Spend less time sitting. Even light physical activity can be beneficial. Watch cholesterol and blood lipids Have your blood tested for lipids and cholesterol at 43 years of age, then have this test every 5 years. Have your cholesterol levels checked more often if: Your lipid or cholesterol levels are high. You are older than 43 years of age. You are at high risk for heart disease. What should I know about cancer screening? Depending on your health history and family history, you may need to have cancer screening at various ages. This may include screening for: Breast cancer. Cervical cancer. Colorectal cancer. Skin cancer. Lung cancer. What should I know about heart disease, diabetes, and high blood pressure? Blood pressure and heart disease High blood pressure causes heart disease and increases the risk of stroke. This is more likely to develop in people who have high blood pressure readings or are overweight. Have your blood pressure checked: Every 3-5 years if you are 17-18 years of age. Every year if you are 13 years old or older. Diabetes Have regular diabetes screenings. This checks your fasting blood sugar level. Have the screening done: Once every three years after age 61 if you are at a normal weight and have a low risk for diabetes. More often and at a younger age if you are overweight or have a high risk for diabetes. What should I know about preventing infection? Hepatitis B If you have a higher risk for hepatitis B, you should be screened for this virus. Talk with your health care provider to find out if you are at  risk for hepatitis B infection. Hepatitis C Testing is recommended for: Everyone born from [birth year] through 1966. Anyone with known risk factors for hepatitis C. Sexually transmitted infections (STIs) Get screened for STIs, including gonorrhea and chlamydia, if: You are sexually active and are younger than 43 years of age. You are  older than 43 years of age and your health care provider tells you that you are at risk for this type of infection. Your sexual activity has changed since you were last screened, and you are at increased risk for chlamydia or gonorrhea. Ask your health care provider if you are at risk. Ask your health care provider about whether you are at high risk for HIV. Your health care provider may recommend a prescription medicine to help prevent HIV infection. If you choose to take medicine to prevent HIV, you should first get tested for HIV. You should then be tested every 3 months for as long as you are taking the medicine. Pregnancy If you are about to stop having your period (premenopausal) and you may become pregnant, seek counseling before you get pregnant. Take 400 to 800 micrograms (mcg) of folic acid every day if you become pregnant. Ask for birth control (contraception) if you want to prevent pregnancy. Osteoporosis and menopause Osteoporosis is a disease in which the bones lose minerals and strength with aging. This can result in bone fractures. If you are 4 years old or older, or if you are at risk for osteoporosis and fractures, ask your health care provider if you should: Be screened for bone loss. Take a calcium or vitamin D supplement to lower your risk of fractures. Be given hormone replacement therapy (HRT) to treat symptoms of menopause. Follow these instructions at home: Alcohol use Do not drink alcohol if: Your health care provider tells you not to drink. You are pregnant, may be pregnant, or are planning to become pregnant. If you drink alcohol: Limit how much you have to: 0-1 drink a day. Know how much alcohol is in your drink. In the [place] one drink equals one 12 oz bottle of beer (355 mL), one 5 oz glass of wine (148 mL), or one 1 oz glass of hard liquor (44 mL). Lifestyle Do not use any products that contain nicotine or tobacco. These products include cigarettes, chewing  tobacco, and vaping devices, such as e-cigarettes. If you need help quitting, ask your health care provider. Do not use street drugs. Do not share needles. Ask your health care provider for help if you need support or information about quitting drugs. General instructions Schedule regular health, dental, and eye exams. Stay current with your vaccines. Tell your health care provider if: You often feel depressed. You have ever been abused or do not feel safe at home. Summary Adopting a healthy lifestyle and getting preventive care are important in promoting health and wellness. Follow your health care provider's instructions about healthy diet, exercising, and getting tested or screened for diseases. Follow your health care provider's instructions on monitoring your cholesterol and blood pressure. This information is not intended to replace advice given to you by your health care provider. Make sure you discuss any questions you have with your health care provider. Document Revised: 09/03/2020 Document Reviewed: 09/03/2020 Elsevier Patient Education  2024 [employer].

## 2023-04-24 MED ORDER — FERROUS SULFATE 325 (65 FE) MG PO TABS: ORAL_TABLET | ORAL | 2 refills | Status: AC

## 2023-04-24 NOTE — Assessment & Plan Note (Signed)
We discussed the importance of regular physical activity and healthy diet for prevention of chronic illness and/or complications. Preventive guidelines reviewed. Vaccination up to date,declined flu vaccine. Continue her female preventive care with her gyn. Next CPE in a year.

## 2023-04-24 NOTE — Assessment & Plan Note (Signed)
She is not on iron supplementation, hx of DUB. Hemoccult cards x 3 negative. Further recommendations according to cbc and iron results.

## 2023-04-24 NOTE — Assessment & Plan Note (Signed)
Continue nonpharmacologic treatment. Further recommendations will be given according to 10 years CVD risk score and lipid panel numbers. 

## 2023-05-01 ENCOUNTER — Encounter: Payer: Self-pay | Admitting: Family Medicine

## 2023-05-09 ENCOUNTER — Other Ambulatory Visit: Payer: Self-pay | Admitting: Family Medicine

## 2023-05-09 DIAGNOSIS — R928 Other abnormal and inconclusive findings on diagnostic imaging of breast: Secondary | ICD-10-CM

## 2023-05-12 ENCOUNTER — Other Ambulatory Visit: Payer: Self-pay | Admitting: Family Medicine

## 2023-05-12 DIAGNOSIS — R921 Mammographic calcification found on diagnostic imaging of breast: Secondary | ICD-10-CM

## 2023-05-26 DIAGNOSIS — Z1231 Encounter for screening mammogram for malignant neoplasm of breast: Secondary | ICD-10-CM

## 2023-06-03 ENCOUNTER — Ambulatory Visit
Admission: RE | Admit: 2023-06-03 | Discharge: 2023-06-03 | Disposition: A | Discharge status: Home / Self Care | Payer: 59 | Source: Ambulatory Visit | Attending: Family Medicine | Admitting: Family Medicine

## 2023-06-03 DIAGNOSIS — R921 Mammographic calcification found on diagnostic imaging of breast: Secondary | ICD-10-CM

## 2023-11-17 IMAGING — MG DIGITAL DIAGNOSTIC BILAT W/ TOMO W/ CAD
8 of 12 series · 8 of 28 positions shown · non-contrast
Comparison: Previous exam(s).

CLINICAL DATA: 40-year-old female presenting for annual bilateral
mammogram and delayed follow-up probably benign right breast
calcifications. Patient also has history of right breast fibrocystic
changes which she says waxes and wanes in size.

EXAM:
DIGITAL DIAGNOSTIC BILATERAL MAMMOGRAM WITH TOMOSYNTHESIS AND CAD;
ULTRASOUND RIGHT BREAST LIMITED
TECHNIQUE: Bilateral digital diagnostic mammography and breast tomosynthesis
was performed. The images were evaluated with computer-aided
detection.; Targeted ultrasound examination of the right breast was
performed

[R CC (1 of 2)]
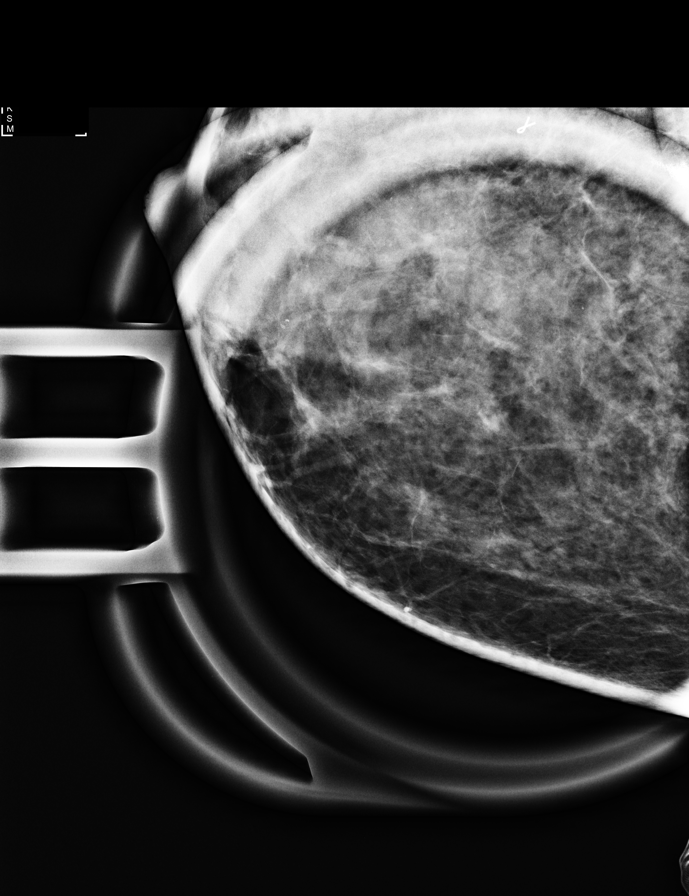

[R ML (1 of 2)]
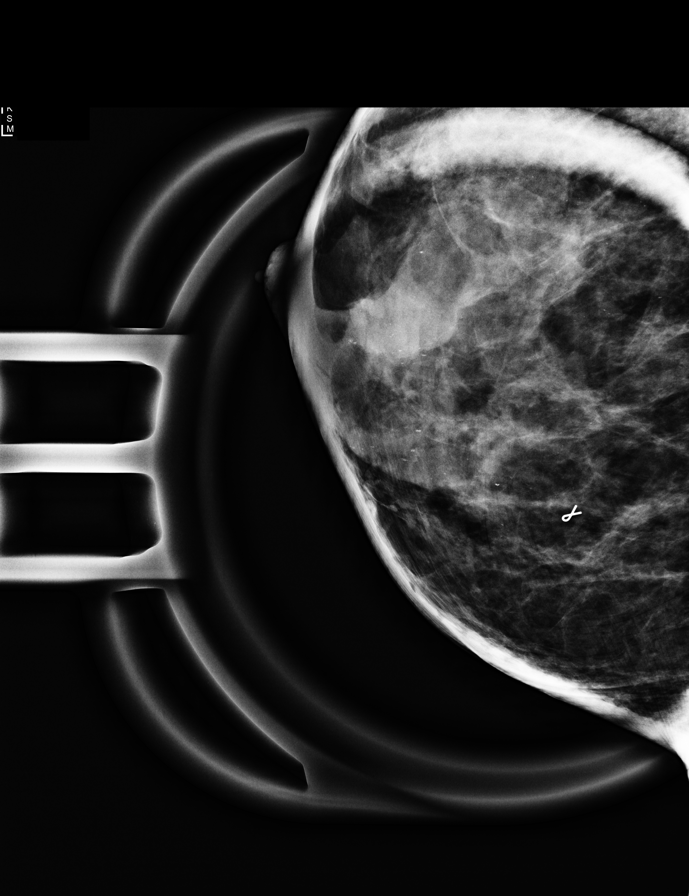

[R CC (2 of 2)]
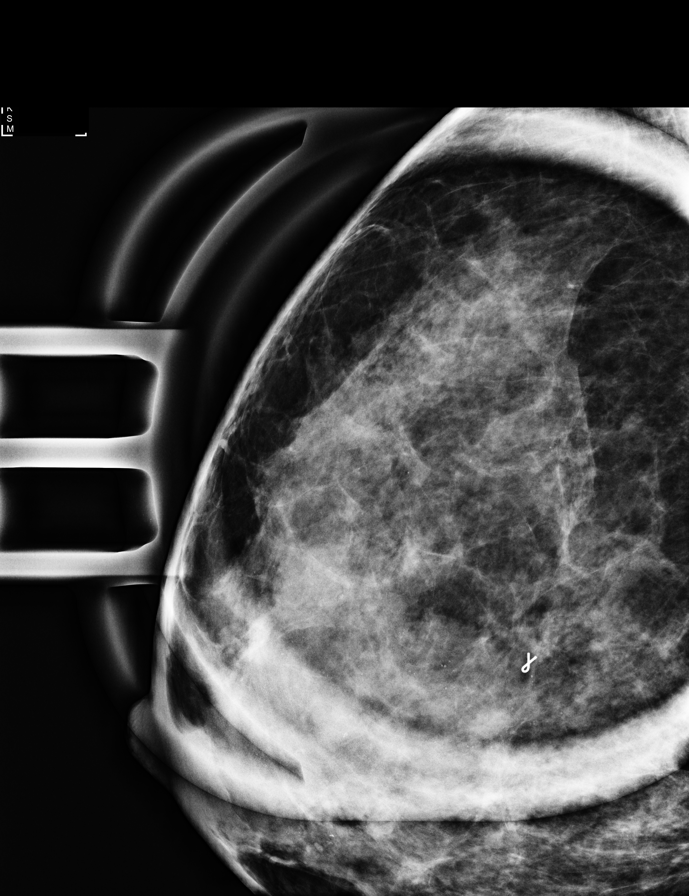

[R ML (2 of 2)]
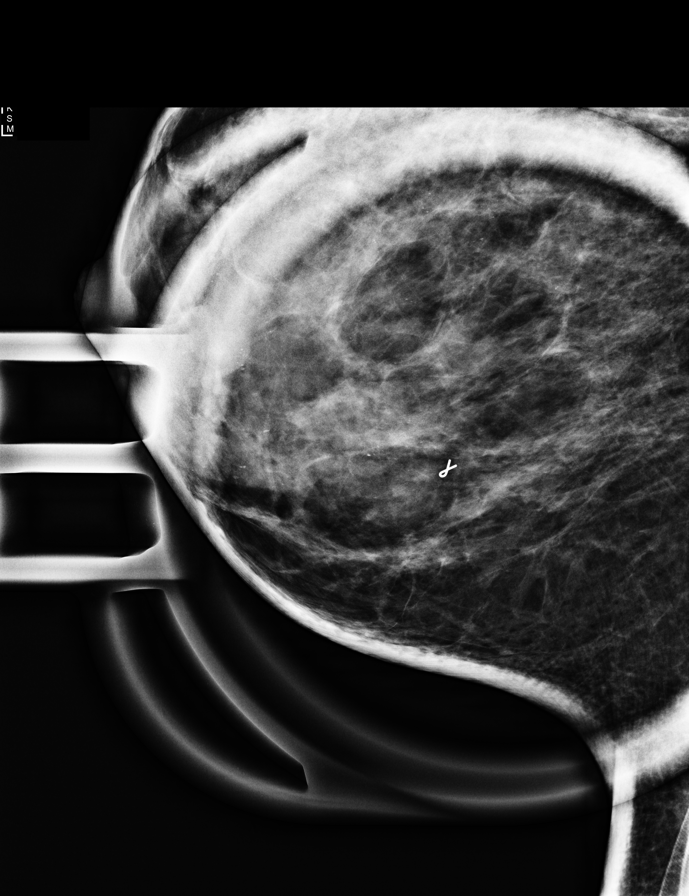

[R CC synth-2D]
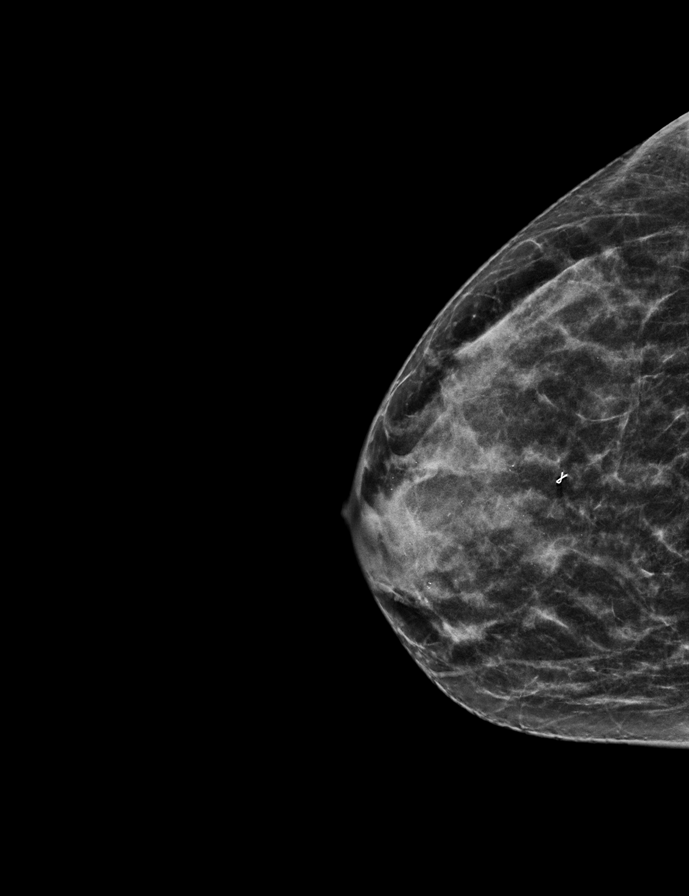

[L CC synth-2D]
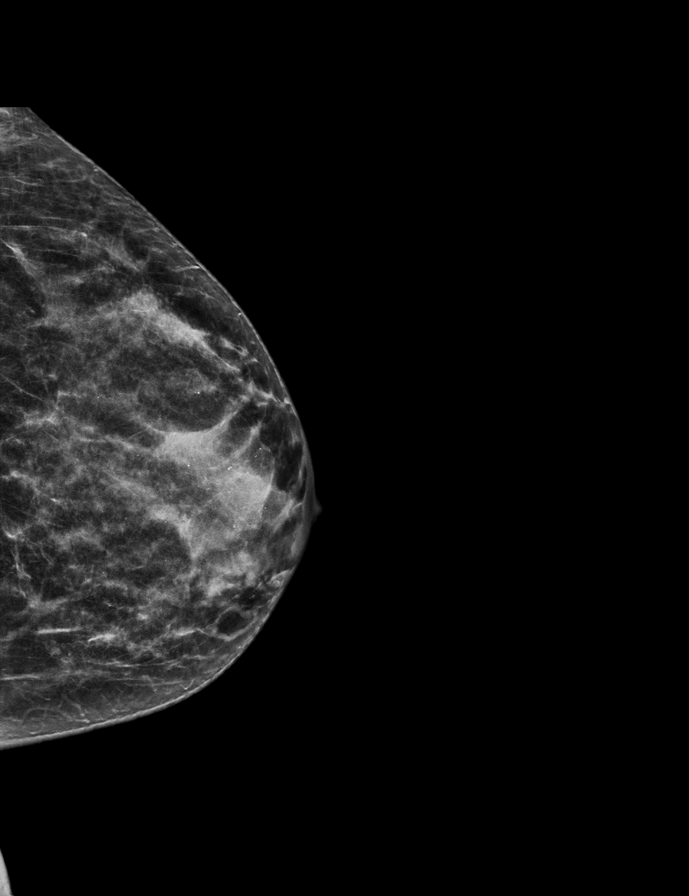

[R MLO synth-2D]
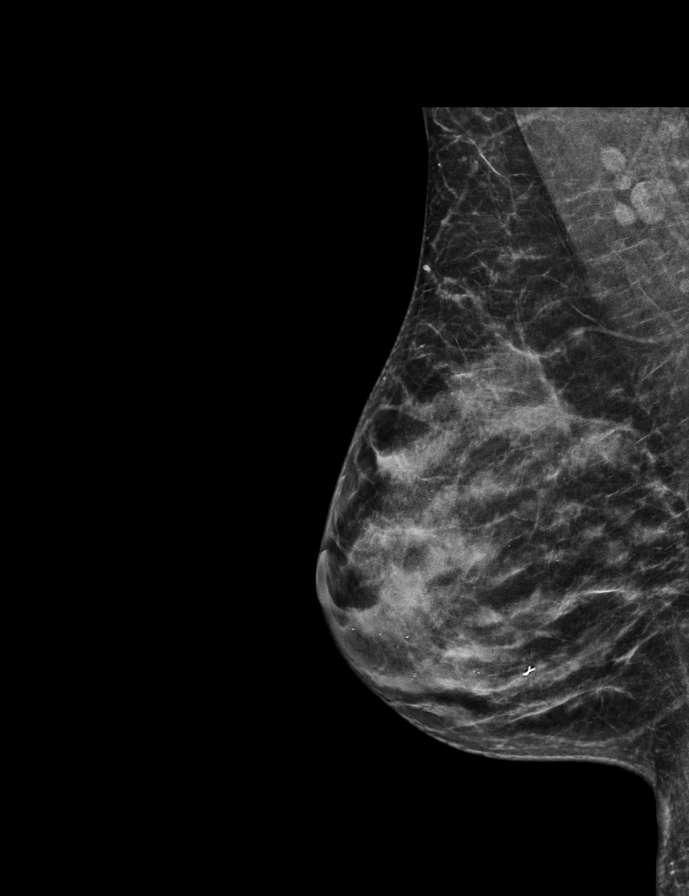

[L MLO synth-2D]
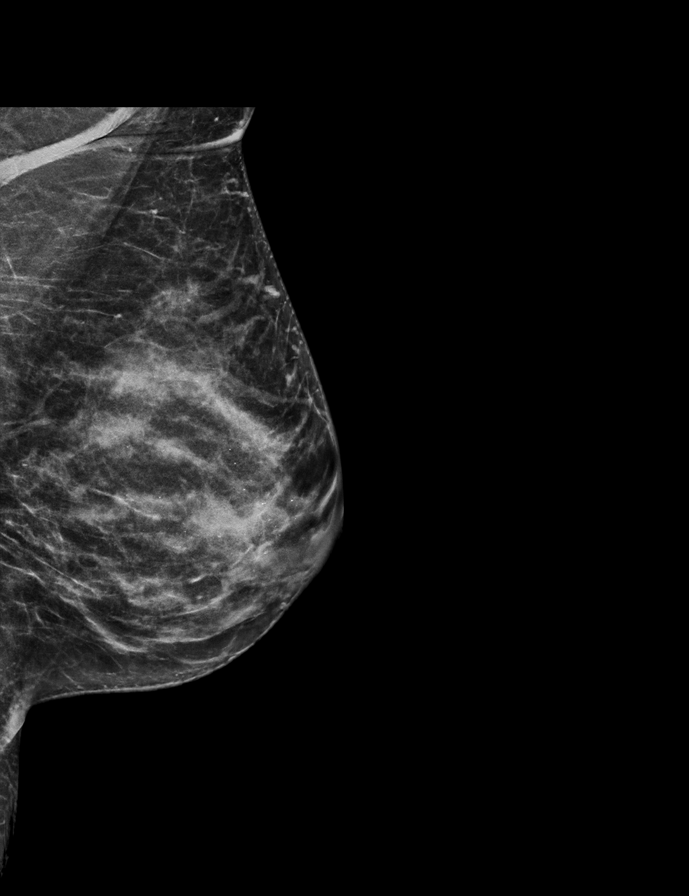

[8 of 28 positions shown; findings below may reference images not displayed]

ACR Breast Density Category c: The breast tissue is heterogeneously
dense, which may obscure small masses.
FINDINGS: Stable scattered, punctate and layering calcifications within the
central right breast. Otherwise, no new or suspicious findings
identified bilaterally.

Targeted ultrasound is performed, showing stable appearance of
scattered, anechoic cysts along the 6 o'clock retroareolar of the
right breast. The largest measures up to 8 mm.
IMPRESSION: 1. Stable, probably benign right breast calcifications. Recommend a
final follow-up in 1 year.
2. Benign right breast fibrocystic changes.
3. No mammographic evidence of malignancy on the left.

RECOMMENDATION:
Bilateral diagnostic mammogram in 1 year.

I have discussed the findings and recommendations with the patient.
If applicable, a reminder letter will be sent to the patient
regarding the next appointment.

BI-RADS CATEGORY  3: Probably benign.

## 2024-05-23 ENCOUNTER — Encounter: Admitting: Family Medicine

## 2024-06-10 ENCOUNTER — Encounter: Admitting: Family Medicine
